# Patient Record
Sex: Male | Born: 1950
Health system: Southern US, Community
[De-identification: ages and names within clinical notes are randomized; demographics above are authoritative.]

## PROBLEM LIST (undated history)

## (undated) DIAGNOSIS — E785 Hyperlipidemia, unspecified: Secondary | ICD-10-CM

## (undated) DIAGNOSIS — E119 Type 2 diabetes mellitus without complications: Secondary | ICD-10-CM

## (undated) DIAGNOSIS — I10 Essential (primary) hypertension: Secondary | ICD-10-CM

## (undated) DIAGNOSIS — I639 Cerebral infarction, unspecified: Secondary | ICD-10-CM

## (undated) HISTORY — DX: Cerebral infarction, unspecified: I63.9

## (undated) HISTORY — DX: Hyperlipidemia, unspecified: E78.5

---

## 2016-07-13 ENCOUNTER — Emergency Department (INDEPENDENT_AMBULATORY_CARE_PROVIDER_SITE_OTHER)
Admission: EM | Admit: 2016-07-13 | Discharge: 2016-07-13 | Disposition: A | Discharge status: Home / Self Care | Payer: Medicare HMO | Source: Home / Self Care | Attending: Family Medicine | Admitting: Family Medicine

## 2016-07-13 ENCOUNTER — Encounter: Payer: Self-pay | Admitting: Emergency Medicine

## 2016-07-13 DIAGNOSIS — R600 Localized edema: Secondary | ICD-10-CM

## 2016-07-13 DIAGNOSIS — Z760 Encounter for issue of repeat prescription: Secondary | ICD-10-CM

## 2016-07-13 DIAGNOSIS — I10 Essential (primary) hypertension: Secondary | ICD-10-CM | POA: Diagnosis not present

## 2016-07-13 HISTORY — DX: Essential (primary) hypertension: I10

## 2016-07-13 HISTORY — DX: Type 2 diabetes mellitus without complications: E11.9

## 2016-07-13 LAB — POCT CBC W AUTO DIFF (K'VILLE URGENT CARE)

## 2016-07-13 MED ORDER — CLOPIDOGREL BISULFATE 75 MG PO TABS: 75.0000 mg | ORAL_TABLET | Freq: Every day | ORAL | 1 refills | Status: DC

## 2016-07-13 MED ORDER — LISINOPRIL-HYDROCHLOROTHIAZIDE 20-12.5 MG PO TABS: 1.0000 | ORAL_TABLET | Freq: Every day | ORAL | 0 refills | Status: DC

## 2016-07-13 NOTE — Discharge Instructions (Signed)
°  It is important to make a follow up appointment with primary care to review today's blood tests and to make sure you are on the correct medications as you will likely need to be placed back on your diabetes medication once the labs come back in 2-3 days.    If your blood pressure continues to be uncontrolled, you are at risk for a stroke, heart attack, or even kidney failure.   Because you have been off your medication for about 1 year, your blood pressure medication may initially cause your blood pressure to drop too low too fast so do not drive shortly after taking your first dose of medication so you can see how your body reacts.

## 2016-07-13 NOTE — ED Triage Notes (Signed)
Swelling feet and ankles x 6 months moved here from [place], needs PCP.

## 2016-07-13 NOTE — ED Provider Notes (Signed)
CSN: [id number]     Arrival date & time 07/13/16  1413 History   First MD Initiated Contact with Patient 07/13/16 1426     Chief Complaint  Patient presents with  . Foot Swelling   (Consider location/radiation/quality/duration/timing/severity/associated sxs/prior Treatment) HPI  Matthew Barton is a 65 y.o. male presenting to UC with c/o bilateral feet and leg swelling for about 6 months.  He is c/o bilateral leg cramping and soreness, described as a "tight" feeling.  He reports moving to the area about 2 years ago from [place]. He has not f/u with a PCP in over 1 year due to lack of insurance so he has been out of his diabetes and blood pressure medication for about 1 year. Denies chest pain or SOB. Denies headache or dizziness.  Pt denies hx of CAD or CHF but is suppose to be taking Plavix.  He is also suppose to be taking Metformin, Novolog and Lisinopril.  He has been on hydrochlorothiazide in the past but not recently.  Past Medical History:  Diagnosis Date  . Diabetes mellitus without complication (HCC)   . Hypertension    History reviewed. No pertinent surgical history. No family history on file. Social History  Substance Use Topics  . Smoking status: Never Smoker  . Smokeless tobacco: Never Used  . Alcohol use No    Review of Systems  Respiratory: Negative for cough, chest tightness and shortness of breath.   Cardiovascular: Positive for leg swelling. Negative for chest pain and palpitations.  Gastrointestinal: Negative for abdominal pain, nausea and vomiting.  Musculoskeletal: Positive for arthralgias, joint swelling and myalgias.  Skin: Negative for color change, rash and wound.  Neurological: Negative for dizziness, syncope, light-headedness and headaches.    Allergies  Statins  Home Medications   Prior to Admission medications   Medication Sig Start Date End Date Taking? Authorizing Provider  insulin aspart (NOVOLOG) 100 UNIT/ML injection Inject into the skin 3 (three)  times daily before meals.   Yes Historical Provider, MD  lisinopril (PRINIVIL,ZESTRIL) 10 MG tablet Take 10 mg by mouth daily.   Yes Historical Provider, MD  metFORMIN (GLUCOPHAGE) 1000 MG tablet Take 1,000 mg by mouth 2 (two) times daily with a meal.   Yes Historical Provider, MD  clopidogrel (PLAVIX) 75 MG tablet Take 1 tablet (75 mg total) by mouth daily. 07/13/16   Junius Finner, PA-C  lisinopril-hydrochlorothiazide (PRINZIDE,ZESTORETIC) 20-12.5 MG tablet Take 1 tablet by mouth daily. 07/13/16   Junius Finner, PA-C   Meds Ordered and Administered this Visit  Medications - No data to display  BP (!) 222/117   Pulse 85   Temp 98.2 F (36.8 C) (Oral)   Ht 5\' 11"  (1.803 m)   Wt (!) 345 lb (156.5 kg)   SpO2 94%   BMI 48.12 kg/m  No data found.   Physical Exam  Constitutional: He is oriented to person, place, and time. He appears well-developed and well-nourished. No distress.  Obese male sitting on exam bed, NAD.  HENT:  Head: Normocephalic and atraumatic.  Eyes: EOM are normal.  Neck: Normal range of motion.  Cardiovascular: Normal rate, regular rhythm and normal heart sounds.   Pulmonary/Chest: Effort normal and breath sounds normal. No respiratory distress. He has no wheezes. He has no rales.  Musculoskeletal: Normal range of motion. He exhibits edema and tenderness. He exhibits no deformity.  Bilateral legs and feet: 3+ pitting edema. Mild tenderness to feet. Full ROM ankle and toes. Calves are soft, non-tender.  Neurological:  He is alert and oriented to person, place, and time.  Skin: Skin is warm and dry. He is not diaphoretic.  Psychiatric: He has a normal mood and affect. His behavior is normal.  Nursing note and vitals reviewed.   Urgent Care Course   Clinical Course    Procedures (including critical care time)  Labs Review Labs Reviewed  COMPLETE METABOLIC PANEL WITH GFR  POCT CBC W AUTO DIFF ([facility])    Imaging Review No results  found.   MDM   1. Uncontrolled hypertension   2. Medication refill   3. Pedal edema    Pt presenting to UC with c/o bilateral leg swelling. Pt denies chest pain, SOB, headache or dizziness.  Leg swelling has progressed over 6 months. Insurance has now allowed him to come be evaluated.  BP- 207/99 and recheck- 222/117.  Likely due to being off his medications for so long. Pt is asymptomatic besides leg swelling.  CBC- unremarkable CMP- pending. Pt plans on scheduling f/u appointment with a PCP in 1-2 weeks. Rx: lisinopril-hydrochlorothiazide and Plavix. Will wait for baseline labs before restarting pt on diabetes medications.  Discussed symptoms that warrant emergent care in the ED including chest pain, SOB, headache or dizziness. Patient verbalized understanding and agreement with treatment plan.     Junius FinnerErin O'Malley, PA-C 07/13/16 1704

## 2016-07-14 ENCOUNTER — Telehealth: Payer: Self-pay | Admitting: *Deleted

## 2016-07-14 LAB — COMPLETE METABOLIC PANEL WITH GFR
ALT: 10 U/L (ref 9–46)
AST: 13 U/L (ref 10–35)
Albumin: 4.1 g/dL (ref 3.6–5.1)
Alkaline Phosphatase: 44 U/L (ref 40–115)
BUN: 10 mg/dL (ref 7–25)
CO2: 29 mmol/L (ref 20–31)
Calcium: 9.4 mg/dL (ref 8.6–10.3)
Chloride: 96 mmol/L — ABNORMAL LOW (ref 98–110)
Creat: 0.95 mg/dL (ref 0.70–1.25)
GFR, Est African American: >89 mL/min (ref >=60)
GFR, Est Non African American: 84 mL/min (ref >=60)
Glucose, Bld: 273 mg/dL — ABNORMAL HIGH (ref 65–99)
Potassium: 5 mmol/L (ref 3.5–5.3)
Sodium: 138 mmol/L (ref 135–146)
Total Bilirubin: 0.5 mg/dL (ref 0.2–1.2)
Total Protein: 7.3 g/dL (ref 6.1–8.1)

## 2016-07-14 NOTE — Telephone Encounter (Signed)
Callback: No answer. LMOM Labs WNL except for elevated glucose. Strongly encouraged f/u or establishment with PCP to control glucose and follow-up on hypertension. Callback as needed.

## 2016-07-22 ENCOUNTER — Ambulatory Visit (INDEPENDENT_AMBULATORY_CARE_PROVIDER_SITE_OTHER): Payer: Medicare HMO | Admitting: Osteopathic Medicine

## 2016-07-22 ENCOUNTER — Ambulatory Visit (INDEPENDENT_AMBULATORY_CARE_PROVIDER_SITE_OTHER): Payer: Medicare HMO

## 2016-07-22 ENCOUNTER — Encounter: Payer: Self-pay | Admitting: Osteopathic Medicine

## 2016-07-22 VITALS — BP 203/87 | HR 78 | Ht 71.0 in | Wt 338.0 lb

## 2016-07-22 DIAGNOSIS — E1159 Type 2 diabetes mellitus with other circulatory complications: Secondary | ICD-10-CM | POA: Diagnosis not present

## 2016-07-22 DIAGNOSIS — M545 Low back pain, unspecified: Secondary | ICD-10-CM

## 2016-07-22 DIAGNOSIS — G8929 Other chronic pain: Secondary | ICD-10-CM | POA: Insufficient documentation

## 2016-07-22 DIAGNOSIS — Z8673 Personal history of transient ischemic attack (TIA), and cerebral infarction without residual deficits: Secondary | ICD-10-CM | POA: Insufficient documentation

## 2016-07-22 DIAGNOSIS — E1165 Type 2 diabetes mellitus with hyperglycemia: Secondary | ICD-10-CM

## 2016-07-22 DIAGNOSIS — Z23 Encounter for immunization: Secondary | ICD-10-CM

## 2016-07-22 DIAGNOSIS — M4316 Spondylolisthesis, lumbar region: Secondary | ICD-10-CM

## 2016-07-22 DIAGNOSIS — IMO0002 Reserved for concepts with insufficient information to code with codable children: Secondary | ICD-10-CM | POA: Insufficient documentation

## 2016-07-22 DIAGNOSIS — I10 Essential (primary) hypertension: Secondary | ICD-10-CM | POA: Insufficient documentation

## 2016-07-22 DIAGNOSIS — M7989 Other specified soft tissue disorders: Secondary | ICD-10-CM

## 2016-07-22 DIAGNOSIS — R600 Localized edema: Secondary | ICD-10-CM | POA: Insufficient documentation

## 2016-07-22 MED ORDER — HYDROCHLOROTHIAZIDE 25 MG PO TABS: 25.0000 mg | ORAL_TABLET | Freq: Every day | ORAL | 1 refills | Status: DC

## 2016-07-22 MED ORDER — BLOOD GLUCOSE MONITOR KIT: PACK | 0 refills | Status: DC

## 2016-07-22 MED ORDER — NAPROXEN 500 MG PO TABS: 500.0000 mg | ORAL_TABLET | Freq: Two times a day (BID) | ORAL | 3 refills | Status: DC

## 2016-07-22 MED ORDER — LISINOPRIL 40 MG PO TABS: 40.0000 mg | ORAL_TABLET | Freq: Every day | ORAL | 1 refills | Status: DC

## 2016-07-22 MED ORDER — METFORMIN HCL 1000 MG PO TABS: ORAL_TABLET | ORAL | 3 refills | Status: DC

## 2016-07-22 NOTE — Progress Notes (Signed)
HPI: Matthew Barton is a 65 y.o. male  who presents to [facility] today, 07/22/16,  for chief complaint of:  Chief Complaint  Patient presents with  . Establish Care    BACK PAIN    Uncontrolled hypertension BP 222/117 in urgent care 07-2016, patient at that time was restarted on lisinopril-HCTZ 20-12 0.5 mg. Blood pressure today slightly improved. No chest pain, pressure, headache, shortness of breath.    Edema, lower extremity This is main reason he went to urgent care, has been ongoing for about a year.    Chronic bilateral low back pain without sciatica Main concern for today's visit in addition to swelling and blood pressure problems as noted above. He reports ongoing back pain for about a year. Never been to physical therapy, never had injections/MRI/x-ray. Thinks he may have been on meloxicam before. Did not tolerate muscle relaxers well. Like to avoid opiate pain medications if possible.    Type II diabetes mellitus, uncontrolled ([place]) Currently not on any medications. Patient states she was previously on insulin.    History of CVA (cerebrovascular accident) Some residual left-sided weakness after CVA 2 or 3 years ago. Patient is on Plavix.       Past medical, surgical, social and family history reviewed: Past Medical History:  Diagnosis Date  . Diabetes mellitus without complication ([place])   . Hyperlipidemia   . Hypertension   . Stroke [facility])    History reviewed. No pertinent surgical history. Social History  Substance Use Topics  . Smoking status: Never Smoker  . Smokeless tobacco: Never Used  . Alcohol use No   History reviewed. No pertinent family history.   Current medication list and allergy/intolerance information reviewed:   Current Outpatient Prescriptions  Medication Sig Dispense Refill  . clopidogrel (PLAVIX) 75 MG tablet Take 1 tablet (75 mg total) by mouth daily. 30 tablet 1  . lisinopril-hydrochlorothiazide  (PRINZIDE,ZESTORETIC) 20-12.5 MG tablet Take 1 tablet by mouth daily. 30 tablet 0   No current facility-administered medications for this visit.    Allergies  Allergen Reactions  . Statins       Review of Systems:  Constitutional:  No  fever, no chills, No recent illness, No unintentional weight changes. No significant fatigue.   HEENT: No  headache, no vision change, no hearing change, No sore throat, No  sinus pressure  Cardiac: No  chest pain, No  pressure, No palpitations, No  Orthopnea  Respiratory:  No  shortness of breath. +Cough  Gastrointestinal: No  abdominal pain, No  nausea, No  vomiting,  No  blood in stool, No  diarrhea, No  constipation   Musculoskeletal: No new myalgia/arthralgia  Genitourinary: No  incontinence, No  abnormal genital bleeding, No abnormal genital discharge, +concern w/ sexual function  Skin: No  Rash, No other wounds/concerning lesions  Hem/Onc: No  easy bruising/bleeding, No  abnormal lymph node  Endocrine: No cold intolerance,  No heat intolerance. +polyuria, no polydipsia/polyphagia   Neurologic: No  weakness, No  dizziness, No  slurred speech/focal weakness/facial droop  Psychiatric: No  concerns with depression, No  concerns with anxiety, +sleep problems, No mood problems  Exam:  BP (!) 203/87   Pulse 78   Ht '5\' 11"'$  (1.803 m)   Wt (!) 338 lb (153.3 kg)   BMI 47.14 kg/m    Constitutional: VS see above. General Appearance: alert, well-developed, obese, NAD  Eyes: Normal lids and conjunctive, non-icteric sclera  Ears, Nose, Mouth, Throat: MMM  Neck: No masses, trachea midline. No thyroid enlargement. No tenderness/mass appreciated. No lymphadenopathy  Respiratory: Normal respiratory effort. no wheeze, no rhonchi, no rales  Cardiovascular: S1/S2 normal, no murmur, no rub/gallop auscultated. RRR. +1-2 lower extremity edema. Pedal pulse palpable. No JVD.   Musculoskeletal: Gait normal. No midline tenderness  Neurological:  Normal balance/coordination. No tremor.   Skin: warm, dry, intact. No rash/ulcer.  Psychiatric: Normal judgment/insight. Normal mood and affect. Oriented x3.     ASSESSMENT/PLAN:   Chronic bilateral low back pain without sciatica - Plan: DG Lumbar Spine Complete, naproxen (NAPROSYN) 500 MG tablet  Need for prophylactic vaccination and inoculation against influenza - Plan: Flu Vaccine QUAD 36+ mos IM  Uncontrolled type 2 diabetes mellitus with other circulatory complication, without long-term current use of insulin ([place]) - Plan: metFORMIN (GLUCOPHAGE) 1000 MG tablet, Hemoglobin A1c, blood glucose meter kit and supplies KIT  Uncontrolled hypertension - Plan: lisinopril (PRINIVIL,ZESTRIL) 40 MG tablet, hydrochlorothiazide (HYDRODIURIL) 25 MG tablet, CBC with Differential/Platelet, COMPLETE METABOLIC PANEL WITH GFR, Lipid panel, TSH, B Nat Peptide  Swelling of lower extremity - Plan: B Nat Peptide  Edema, lower extremity  History of CVA (cerebrovascular accident)     Patient Instructions  Blood pressure: Double up on your current Lisinopril/HCT medication until out, then start the Lisinopril 40 mg once per day + HCT 25 mg once per day  Diabetes: restart metformin, we will need to see what the A1C is looking like to determine addition of other medications or starting insulin.   Back pain: starting antiinflammatory Naproxen and getting Xray today, followup based on response to medications. See attached for home exercises.     Visit summary with medication list and pertinent instructions was printed for patient to review. All questions at time of visit were answered - patient instructed to contact office with any additional concerns. ER/RTC precautions were reviewed with the patient. Follow-up plan: Return in about 1 week (around 07/29/2016) for BP AND LABS/DIABETES FOLLOWUP (OV30).

## 2016-07-22 NOTE — Patient Instructions (Addendum)
Blood pressure: Double up on your current Lisinopril/HCT medication until out, then start the Lisinopril 40 mg once per day + HCT 25 mg once per day  Diabetes: restart metformin, we will need to see what the A1C is looking like to determine addition of other medications or starting insulin.   Back pain: starting antiinflammatory Naproxen and getting Xray today, followup based on response to medications. See attached for home exercises.

## 2016-07-23 LAB — CBC WITH DIFFERENTIAL/PLATELET
Basophils Absolute: 0 cells/uL (ref 0–200)
Basophils Relative: 0 %
EOS PCT: 3 %
Eosinophils Absolute: 249 cells/uL (ref 15–500)
HCT: 43.6 % (ref 38.5–50.0)
HEMOGLOBIN: 14.5 g/dL (ref 13.2–17.1)
LYMPHS ABS: 3569 {cells}/uL (ref 850–3900)
LYMPHS PCT: 43 %
MCH: 26.2 pg — ABNORMAL LOW (ref 27.0–33.0)
MCHC: 33.3 g/dL (ref 32.0–36.0)
MCV: 78.7 fL — ABNORMAL LOW (ref 80.0–100.0)
MPV: 10.6 fL (ref 7.5–12.5)
Monocytes Absolute: 747 cells/uL (ref 200–950)
Monocytes Relative: 9 %
NEUTROS PCT: 45 %
Neutro Abs: 3735 cells/uL (ref 1500–7800)
PLATELETS: 355 10*3/uL (ref 140–400)
RBC: 5.54 MIL/uL (ref 4.20–5.80)
RDW: 14.2 % (ref 11.0–15.0)
WBC: 8.3 10*3/uL (ref 3.8–10.8)

## 2016-07-23 LAB — COMPLETE METABOLIC PANEL WITH GFR
ALT: 14 U/L (ref 9–46)
AST: 13 U/L (ref 10–35)
Albumin: 4.2 g/dL (ref 3.6–5.1)
Alkaline Phosphatase: 50 U/L (ref 40–115)
BUN: 16 mg/dL (ref 7–25)
CHLORIDE: 95 mmol/L — AB (ref 98–110)
CO2: 31 mmol/L (ref 20–31)
Calcium: 9.5 mg/dL (ref 8.6–10.3)
Creat: 1.15 mg/dL (ref 0.70–1.25)
GFR, Est African American: 77 mL/min (ref >=60)
GFR, Est Non African American: 66 mL/min (ref >=60)
GLUCOSE: 382 mg/dL — AB (ref 65–99)
POTASSIUM: 4.7 mmol/L (ref 3.5–5.3)
SODIUM: 135 mmol/L (ref 135–146)
Total Bilirubin: 0.5 mg/dL (ref 0.2–1.2)
Total Protein: 7.3 g/dL (ref 6.1–8.1)

## 2016-07-23 LAB — LIPID PANEL
Cholesterol: 283 mg/dL — ABNORMAL HIGH (ref 125–200)
HDL: 37 mg/dL — AB (ref >=40)
LDL CALC: 172 mg/dL — AB (ref <130)
TRIGLYCERIDES: 370 mg/dL — AB (ref <150)
Total CHOL/HDL Ratio: 7.6 Ratio — ABNORMAL HIGH (ref <=5.0)
VLDL: 74 mg/dL — AB (ref <30)

## 2016-07-23 LAB — TSH: TSH: 2.28 mIU/L (ref 0.40–4.50)

## 2016-07-23 LAB — BRAIN NATRIURETIC PEPTIDE: Brain Natriuretic Peptide: 47.4 pg/mL (ref <100)

## 2016-07-23 LAB — HEMOGLOBIN A1C
Hgb A1c MFr Bld: 11.4 % — ABNORMAL HIGH (ref <5.7)
Mean Plasma Glucose: 280 mg/dL

## 2016-07-29 ENCOUNTER — Encounter: Payer: Self-pay | Admitting: Osteopathic Medicine

## 2016-07-29 ENCOUNTER — Ambulatory Visit (INDEPENDENT_AMBULATORY_CARE_PROVIDER_SITE_OTHER): Payer: Medicare HMO | Admitting: Osteopathic Medicine

## 2016-07-29 VITALS — BP 203/81 | HR 78 | Ht 71.0 in | Wt 331.0 lb

## 2016-07-29 DIAGNOSIS — E1159 Type 2 diabetes mellitus with other circulatory complications: Secondary | ICD-10-CM | POA: Diagnosis not present

## 2016-07-29 DIAGNOSIS — E1142 Type 2 diabetes mellitus with diabetic polyneuropathy: Secondary | ICD-10-CM | POA: Insufficient documentation

## 2016-07-29 DIAGNOSIS — I10 Essential (primary) hypertension: Secondary | ICD-10-CM | POA: Diagnosis not present

## 2016-07-29 DIAGNOSIS — E7800 Pure hypercholesterolemia, unspecified: Secondary | ICD-10-CM

## 2016-07-29 DIAGNOSIS — IMO0002 Reserved for concepts with insufficient information to code with codable children: Secondary | ICD-10-CM

## 2016-07-29 DIAGNOSIS — G8929 Other chronic pain: Secondary | ICD-10-CM

## 2016-07-29 DIAGNOSIS — E1165 Type 2 diabetes mellitus with hyperglycemia: Secondary | ICD-10-CM

## 2016-07-29 DIAGNOSIS — M545 Low back pain, unspecified: Secondary | ICD-10-CM

## 2016-07-29 DIAGNOSIS — R600 Localized edema: Secondary | ICD-10-CM

## 2016-07-29 DIAGNOSIS — Z8673 Personal history of transient ischemic attack (TIA), and cerebral infarction without residual deficits: Secondary | ICD-10-CM

## 2016-07-29 MED ORDER — PITAVASTATIN CALCIUM 2 MG PO TABS: 2.0000 mg | ORAL_TABLET | Freq: Every day | ORAL | 3 refills | Status: DC

## 2016-07-29 MED ORDER — PRAVASTATIN SODIUM 20 MG PO TABS
20.0000 mg | ORAL_TABLET | Freq: Every day | ORAL | 1 refills | Status: DC
Start: 2016-07-29 — End: 2016-07-29

## 2016-07-29 MED ORDER — INSULIN GLARGINE 300 UNIT/ML ~~LOC~~ SOPN: 20.0000 [IU] | PEN_INJECTOR | Freq: Every day | SUBCUTANEOUS | 3 refills | Status: DC

## 2016-07-29 MED ORDER — FUROSEMIDE 40 MG PO TABS: 40.0000 mg | ORAL_TABLET | Freq: Two times a day (BID) | ORAL | 1 refills | Status: DC

## 2016-07-29 NOTE — Progress Notes (Signed)
HPI: Matthew Barton is a 65 y.o. male  who presents to [facility] today, 07/29/16,  for chief complaint of:  Chief Complaint  Patient presents with  . Follow-up    HTN, DM2, Back Pain    Uncontrolled hypertension BP 222/117 in urgent care 07-2016, patient at that time was restarted on lisinopril-HCTZ 20-12 0.5 mg. Blood pressure last visit was slightly improved but is up today despite medication adjustment last visit. No chest pain, pressure, headache, shortness of breath.    Edema, lower extremity This is main reason he went to urgent care, has been ongoing for about a year. BNP 07/22/16 was normal, no Hx CHF.  Chronic bilateral low back pain without sciatica Main concern for last visit in addition to swelling and blood pressure problems as noted above. He reports ongoing back pain for about a year. Never been to physical therapy, never had injections/MRI/x-ray. Thinks he may have been on meloxicam before. Did not tolerate muscle relaxers well. Like to avoid opiate pain medications if possible.    Type II diabetes mellitus, uncontrolled (HCC) Currently not on any medications. Patient states he was previously on insulin. Fasting Glc 327 lately down from 400s. Loose stool since starting back on the metformin, he increased the dose pretty rapidly up to 1000 twice a day   History of CVA (cerebrovascular accident) Some residual left-sided weakness after CVA 2 or 3 years ago. Patient is on Plavix. No new weakness. Statin intolerance      Past medical, surgical, social and family history reviewed: Past Medical History:  Diagnosis Date  . Diabetes mellitus without complication (HCC)   . Hyperlipidemia   . Hypertension   . Stroke [facility])    No past surgical history on file. Social History  Substance Use Topics  . Smoking status: Never Smoker  . Smokeless tobacco: Never Used  . Alcohol use No   No family history on file.   Current medication list and  allergy/intolerance information reviewed:   Current Outpatient Prescriptions  Medication Sig Dispense Refill  . blood glucose meter kit and supplies KIT Dispense based on patient and insurance preference. Use up to four times daily as directed. ICD 10 Dx: E11.59. Please dispense lancets x100 refill 99, test strips x100 refill 99, control solution x1 refill 99, and sharps container 1 each 0  . clopidogrel (PLAVIX) 75 MG tablet Take 1 tablet (75 mg total) by mouth daily. 30 tablet 1  . hydrochlorothiazide (HYDRODIURIL) 25 MG tablet Take 1 tablet (25 mg total) by mouth daily. 30 tablet 1  . lisinopril (PRINIVIL,ZESTRIL) 40 MG tablet Take 1 tablet (40 mg total) by mouth daily. 30 tablet 1  . metFORMIN (GLUCOPHAGE) 1000 MG tablet Start at 1/2 tablet (500 mg) by mouth daily and increase as tolerated to goal dose of 1 tablet (1000 mg) twice daily 180 tablet 3  . naproxen (NAPROSYN) 500 MG tablet Take 1 tablet (500 mg total) by mouth 2 (two) times daily with a meal. Prn pain 60 tablet 3   No current facility-administered medications for this visit.    Allergies  Allergen Reactions  . Statins       Review of Systems:  Constitutional:  No  fever, no chills, No recent illness  HEENT: No  headache, no vision change  Cardiac: No  chest pain, No  pressure, No palpitations, No  Orthopnea  Respiratory:  No  shortness of breath  Gastrointestinal: No  abdominal pain   Musculoskeletal: No new  myalgia/arthralgia  Exam:  BP (!) 203/81   Pulse 78   Ht '5\' 11"'$  (1.803 m)   Wt (!) 331 lb (150.1 kg)   BMI 46.17 kg/m    Constitutional: VS see above. General Appearance: alert, well-developed, obese, NAD  Eyes: Normal lids and conjunctive, non-icteric sclera  Ears, Nose, Mouth, Throat: MMM  Neck: No masses, trachea midline. No thyroid enlargement. No tenderness/mass appreciated. No lymphadenopathy  Respiratory: Normal respiratory effort. no wheeze, no rhonchi, no rales  Cardiovascular: S1/S2  normal, no murmur, no rub/gallop auscultated. RRR. +1 lower extremity edema. Pedal pulse palpable. No JVD.   Musculoskeletal: Gait normal. No midline tenderness  Neurological: Normal balance/coordination. No tremor.   Skin: warm, dry, intact. No rash/ulcer.  Psychiatric: Normal judgment/insight. Normal mood and affect. Oriented x3      07/13/2016 07/22/2016 Most Recent Value Last 5 Years  BLOOD PRESSURE - SYSTOLIC 694 [id number] 03/50/0938  BLOOD PRESSURE - DIASTOLIC 182 87 87 99/37/1696    Recent Results (from the past 2160 hour(s))  COMPLETE METABOLIC PANEL WITH GFR     Status: Abnormal   Collection Time: 07/13/16  2:33 PM  Result Value Ref Range   Sodium 138 135 - 146 mmol/L   Potassium 5.0 3.5 - 5.3 mmol/L   Chloride 96 (L) 98 - 110 mmol/L   CO2 29 20 - 31 mmol/L   Glucose, Bld 273 (H) 65 - 99 mg/dL   BUN 10 7 - 25 mg/dL   Creat 0.95 0.70 - 1.25 mg/dL    Comment:   For patients > or = 65 years of age: The upper reference limit for Creatinine is approximately 13% higher for people identified as African-American.      Total Bilirubin 0.5 0.2 - 1.2 mg/dL   Alkaline Phosphatase 44 40 - 115 U/L   AST 13 10 - 35 U/L   ALT 10 9 - 46 U/L   Total Protein 7.3 6.1 - 8.1 g/dL   Albumin 4.1 3.6 - 5.1 g/dL   Calcium 9.4 8.6 - 10.3 mg/dL   GFR, Est African American >89 >=60 mL/min   GFR, Est Non African American 84 >=60 mL/min  POCT CBC w auto diff     Status: None   Collection Time: 07/13/16  2:47 PM  Result Value Ref Range   WBC  4.5 - 10.5 K/uL    Comment: see scanned report   Lymphocytes relative %  15 - 45 %   Monocytes relative %  2 - 10 %   Neutrophils relative % (GR)  44 - 76 %   Lymphocytes absolute  0.1 - 1.8 K/uL   Monocyes absolute  0.1 - 1 K/uL   Neutrophils absolute (GR#)  1.7 - 7.7 K/uL   RBC  4.2 - 5.8 MIL/uL   Hemoglobin  13 - 17 g/dL   Hematocrit  38.5 - 51 %   MCV  80 - 98 fL   MCH  26.5 - 32.5 pg   MCHC  32.5 - 36.9 g/dL   RDW  11.6 - 14 %    Platelet count  140 - 400 K/uL   MPV  7.8 - 11 fL  CBC with Differential/Platelet     Status: Abnormal   Collection Time: 07/22/16 11:55 AM  Result Value Ref Range   WBC 8.3 3.8 - 10.8 K/uL   RBC 5.54 4.20 - 5.80 MIL/uL   Hemoglobin 14.5 13.2 - 17.1 g/dL   HCT 43.6 38.5 - 50.0 %  MCV 78.7 (L) 80.0 - 100.0 fL   MCH 26.2 (L) 27.0 - 33.0 pg   MCHC 33.3 32.0 - 36.0 g/dL   RDW 14.2 11.0 - 15.0 %   Platelets 355 140 - 400 K/uL   MPV 10.6 7.5 - 12.5 fL   Neutro Abs 3,735 1,500 - 7,800 cells/uL   Lymphs Abs 3,569 850 - 3,900 cells/uL   Monocytes Absolute 747 200 - 950 cells/uL   Eosinophils Absolute 249 15 - 500 cells/uL   Basophils Absolute 0 0 - 200 cells/uL   Neutrophils Relative % 45 %   Lymphocytes Relative 43 %   Monocytes Relative 9 %   Eosinophils Relative 3 %   Basophils Relative 0 %   Smear Review Criteria for review not met   COMPLETE METABOLIC PANEL WITH GFR     Status: Abnormal   Collection Time: 07/22/16 11:55 AM  Result Value Ref Range   Sodium 135 135 - 146 mmol/L   Potassium 4.7 3.5 - 5.3 mmol/L   Chloride 95 (L) 98 - 110 mmol/L   CO2 31 20 - 31 mmol/L   Glucose, Bld 382 (H) 65 - 99 mg/dL   BUN 16 7 - 25 mg/dL   Creat 1.15 0.70 - 1.25 mg/dL    Comment:   For patients > or = 65 years of age: The upper reference limit for Creatinine is approximately 13% higher for people identified as African-American.      Total Bilirubin 0.5 0.2 - 1.2 mg/dL   Alkaline Phosphatase 50 40 - 115 U/L   AST 13 10 - 35 U/L   ALT 14 9 - 46 U/L   Total Protein 7.3 6.1 - 8.1 g/dL   Albumin 4.2 3.6 - 5.1 g/dL   Calcium 9.5 8.6 - 10.3 mg/dL   GFR, Est African American 77 >=60 mL/min   GFR, Est Non African American 66 >=60 mL/min  Hemoglobin A1c     Status: Abnormal   Collection Time: 07/22/16 11:55 AM  Result Value Ref Range   Hgb A1c MFr Bld 11.4 (H) <5.7 %    Comment:   For someone without known diabetes, a hemoglobin A1c value of 6.5% or greater indicates that they may have  diabetes and this should be confirmed with a follow-up test.   For someone with known diabetes, a value <7% indicates that their diabetes is well controlled and a value greater than or equal to 7% indicates suboptimal control. A1c targets should be individualized based on duration of diabetes, age, comorbid conditions, and other considerations.   Currently, no consensus exists for use of hemoglobin A1c for diagnosis of diabetes for children.      Mean Plasma Glucose 280 mg/dL  Lipid panel     Status: Abnormal   Collection Time: 07/22/16 11:55 AM  Result Value Ref Range   Cholesterol 283 (H) 125 - 200 mg/dL   Triglycerides 370 (H) <150 mg/dL   HDL 37 (L) >=40 mg/dL   Total CHOL/HDL Ratio 7.6 (H) <=5.0 Ratio   VLDL 74 (H) <30 mg/dL   LDL Cholesterol 172 (H) <130 mg/dL    Comment:   Total Cholesterol/HDL Ratio:CHD Risk                        Coronary Heart Disease Risk Table  Men       Women          1/2 Average Risk              3.4        3.3              Average Risk              5.0        4.4           2X Average Risk              9.6        7.1           3X Average Risk             23.4       11.0 Use the calculated Patient Ratio above and the CHD Risk table  to determine the patient's CHD Risk.   TSH     Status: None   Collection Time: 07/22/16 11:55 AM  Result Value Ref Range   TSH 2.28 0.40 - 4.50 mIU/L  B Nat Peptide     Status: None   Collection Time: 07/22/16 11:55 AM  Result Value Ref Range   Brain Natriuretic Peptide 47.4 <100 pg/mL    Comment:   BNP levels increase with age in the general population with the highest values seen in individuals greater than 21 years of age. Reference: Joellyn Rued Cardiol 2002; 67:893-81.        ASSESSMENT/PLAN:   Uncontrolled type 2 diabetes mellitus with other circulatory complication, without long-term current use of insulin (HCC) - Plan: Insulin Glargine (TOUJEO SOLOSTAR) 300  UNIT/ML SOPN  Uncontrolled hypertension - Plan: furosemide (LASIX) 40 MG tablet  Chronic bilateral low back pain without sciatica  History of CVA (cerebrovascular accident)  Edema, lower extremity - Plan: furosemide (LASIX) 40 MG tablet  Diabetic polyneuropathy associated with type 2 diabetes mellitus ([place])  Pure hypercholesterolemia - Plan: Pitavastatin Calcium (LIVALO) 2 MG TABS     Patient Instructions  Blood pressure: Lisinopril 40 mg per day Hydrochlorothiazide 25 mg per day We are adding furosemide/Lasix initial dose 20 mg twice per day with planning to increase to 40 mg twice per day, this should help with blood pressure as well as swelling  Diabetes: Reduced dose of metformin to half tablet once or twice per day until loose stool resolves, before increasing dose. Our goal is 1 tablet twice per day but if you continue to have loose stools me know and we will need to switch the formulation or stop the medicine altogether We are restarting insulin in the formulation called Toujeo, this is a long-acting insulin to be taken once per day. Based on your weight, we are starting at a dose of 20 units per day and gradually increasing the dose until your fasting blood sugars are in the 120s. Please let me know if there is any trouble getting the insulin pens or the appropriate needles from your pharmacy.  Cholesterol: We will try a medication called Livalo, if this is not covered by her insurance, it may need to go through a prior authorization due to her previous history of intolerance to statins. If when not able to get Livalo paid for, we will go to pravastatin.   We will also go ahead and place referral for dietitian consultation, with a good meal plan for you.  Your maximum heart rate is calculated at 155.  Would keep your heart rate no higher than 120. Ideally, would exercise for 30 minutes at a time 3-5 times per week.     Visit summary with medication list and pertinent  instructions was printed for patient to review. All questions at time of visit were answered - patient instructed to contact office with any additional concerns. ER/RTC precautions were reviewed with the patient. Follow-up plan: Return in about 1 week (around 08/05/2016) for BLOOD PRESSURE.

## 2016-07-29 NOTE — Patient Instructions (Addendum)
Blood pressure: Lisinopril 40 mg per day Hydrochlorothiazide 25 mg per day We are adding furosemide/Lasix initial dose 20 mg twice per day with planning to increase to 40 mg twice per day, this should help with blood pressure as well as swelling  Diabetes: Reduced dose of metformin to half tablet once or twice per day until loose stool resolves, before increasing dose. Our goal is 1 tablet twice per day but if you continue to have loose stools me know and we will need to switch the formulation or stop the medicine altogether We are restarting insulin in the formulation called Toujeo, this is a long-acting insulin to be taken once per day. Based on your weight, we are starting at a dose of 20 units per day and gradually increasing the dose until your fasting blood sugars are in the 120s. Please let me know if there is any trouble getting the insulin pens or the appropriate needles from your pharmacy.  Cholesterol: We will try a medication called Livalo, if this is not covered by her insurance, it may need to go through a prior authorization due to her previous history of intolerance to statins. If when not able to get Livalo paid for, we will go to pravastatin.   We will also go ahead and place referral for dietitian consultation, with a good meal plan for you.  Your maximum heart rate is calculated at 155. Would keep your heart rate no higher than 120. Ideally, would exercise for 30 minutes at a time 3-5 times per week.

## 2016-08-05 ENCOUNTER — Ambulatory Visit (INDEPENDENT_AMBULATORY_CARE_PROVIDER_SITE_OTHER): Payer: Medicare HMO | Admitting: Osteopathic Medicine

## 2016-08-05 ENCOUNTER — Encounter: Payer: Self-pay | Admitting: Osteopathic Medicine

## 2016-08-05 VITALS — BP 190/90 | HR 83 | Ht 71.0 in | Wt 324.0 lb

## 2016-08-05 DIAGNOSIS — E1142 Type 2 diabetes mellitus with diabetic polyneuropathy: Secondary | ICD-10-CM

## 2016-08-05 DIAGNOSIS — E1159 Type 2 diabetes mellitus with other circulatory complications: Secondary | ICD-10-CM

## 2016-08-05 DIAGNOSIS — R600 Localized edema: Secondary | ICD-10-CM | POA: Diagnosis not present

## 2016-08-05 DIAGNOSIS — E1165 Type 2 diabetes mellitus with hyperglycemia: Secondary | ICD-10-CM

## 2016-08-05 DIAGNOSIS — IMO0002 Reserved for concepts with insufficient information to code with codable children: Secondary | ICD-10-CM

## 2016-08-05 DIAGNOSIS — I10 Essential (primary) hypertension: Secondary | ICD-10-CM

## 2016-08-05 NOTE — Patient Instructions (Signed)
Bring BP cuff to next visit

## 2016-08-05 NOTE — Progress Notes (Signed)
HPI: Matthew Barton is a 65 y.o. male  who presents to [place] today, 08/05/16,  for chief complaint of:  Chief Complaint  Patient presents with  . Follow-up    BLOOD PRESSURE    Follow-up for blood pressure recheck. Notes significant improvement in lower extremity swelling since addition of Lasix, is at full dose at this point. Blood pressure minimally improved, no chest pain pressure or shortness of breath or headache. Has home blood pressure cuff at home, states that it is occasionally measuring systolic in the 527P. He does not have blood pressure cuff with him at this visit  Patient also reports that the Toujeo was not covered and neither was the Livalo. He has not been taking either of these medications because he did not get themselves.   Past medical, surgical, social and family history reviewed: Past Medical History:  Diagnosis Date  . Diabetes mellitus without complication ([place])   . Hyperlipidemia   . Hypertension   . Stroke [facility])    No past surgical history on file. Social History  Substance Use Topics  . Smoking status: Never Smoker  . Smokeless tobacco: Never Used  . Alcohol use No   No family history on file.   Current medication list and allergy/intolerance information reviewed:   Current Outpatient Prescriptions on File Prior to Visit  Medication Sig Dispense Refill  . blood glucose meter kit and supplies KIT Dispense based on patient and insurance preference. Use up to four times daily as directed. ICD 10 Dx: E11.59. Please dispense lancets x100 refill 99, test strips x100 refill 99, control solution x1 refill 99, and sharps container 1 each 0  . clopidogrel (PLAVIX) 75 MG tablet Take 1 tablet (75 mg total) by mouth daily. 30 tablet 1  . furosemide (LASIX) 40 MG tablet Take 1 tablet (40 mg total) by mouth 2 (two) times daily. Start with 1/2 tablet (20 mg total) by mouth 2 (two) times daily for 3 - 5 days then increase to  whole tablet dose. 60 tablet 1  . hydrochlorothiazide (HYDRODIURIL) 25 MG tablet Take 1 tablet (25 mg total) by mouth daily. 30 tablet 1  . Insulin Glargine (TOUJEO SOLOSTAR) 300 UNIT/ML SOPN Inject 20 Units into the skin daily. Increase dose by 3 - 5 units twice per week to goal fasting glucose of 120 then hold at that dose. Please also dispense appropriate pen needles x100 Refill #99 7.5 mL 3  . lisinopril (PRINIVIL,ZESTRIL) 40 MG tablet Take 1 tablet (40 mg total) by mouth daily. 30 tablet 1  . metFORMIN (GLUCOPHAGE) 1000 MG tablet Start at 1/2 tablet (500 mg) by mouth daily and increase as tolerated to goal dose of 1 tablet (1000 mg) twice daily 180 tablet 3  . naproxen (NAPROSYN) 500 MG tablet Take 1 tablet (500 mg total) by mouth 2 (two) times daily with a meal. Prn pain 60 tablet 3  . Pitavastatin Calcium (LIVALO) 2 MG TABS Take 1 tablet (2 mg total) by mouth daily. 30 tablet 3   No current facility-administered medications on file prior to visit.    Allergies  Allergen Reactions  . Statins       Review of Systems:  Constitutional: No recent illness  HEENT: No  headache, no vision change  Cardiac: No  chest pain, No  pressure, No palpitations  Respiratory:  No  shortness of breath. No  Cough  Neurologic: No  weakness, No  Dizziness   Exam:  BP Marland Kitchen)  190/90   Pulse 83   Ht _0  (1.803 m)   Wt (!) 324 lb (147 kg)   BMI 45.19 kg/m   Constitutional: VS see above. General Appearance: alert, well-developed, well-nourished, NAD  Eyes: Normal lids and conjunctive, non-icteric sclera  Ears, Nose, Mouth, Throat: MMM, Normal external inspection ears/nares/mouth/lips/gums.  Neck: No masses, trachea midline.   Respiratory: Normal respiratory effort. no wheeze, no rhonchi, no rales  Cardiovascular: S1/S2 normal, no murmur, no rub/gallop auscultated. RRR.   Musculoskeletal: Gait normal. Symmetric and independent movement of all extremities  Neurological: Normal  balance/coordination. No tremor.  Skin: warm, dry, intact.   Psychiatric: Normal judgment/insight. Normal mood and affect. Oriented x3.     ASSESSMENT/PLAN:  The medications as is for now. Patient to follow-up with home blood pressure cuff measurements and cuff physically with him to the next visit. Will see what happened as far as getting the insulin and the Livalo covered for him. May need prior authorization  Uncontrolled hypertension  Uncontrolled type 2 diabetes mellitus with other circulatory complication, without long-term current use of insulin (HCC)  Edema, lower extremity  Diabetic polyneuropathy associated with type 2 diabetes mellitus ([place])    Patient Instructions  Bring BP cuff to next visit!     Visit summary with medication list and pertinent instructions was printed for patient to review. All questions at time of visit were answered - patient instructed to contact office with any additional concerns. ER/RTC precautions were reviewed with the patient. Follow-up plan: Return in about 2 weeks (around 08/19/2016) for recheck BP and labs .

## 2016-08-07 ENCOUNTER — Telehealth: Payer: Self-pay

## 2016-08-07 ENCOUNTER — Encounter: Payer: Self-pay | Admitting: Osteopathic Medicine

## 2016-08-07 MED ORDER — INSULIN GLARGINE 100 UNIT/ML SOLOSTAR PEN: PEN_INJECTOR | SUBCUTANEOUS | 99 refills | Status: DC

## 2016-08-07 NOTE — Telephone Encounter (Signed)
I think there were some issues getting his Toujeo covered, I switched it over to Lantus. Can send a prescription today via fax, instructions are too long to send electronically.

## 2016-08-07 NOTE — Telephone Encounter (Signed)
Left information on Pt's VM, callback information provided.  

## 2016-08-07 NOTE — Telephone Encounter (Signed)
Patient called stated that he did not get his Rx for Insulin. Please advise. Olivia Pavelko,CMA

## 2016-08-12 ENCOUNTER — Telehealth: Payer: Self-pay | Admitting: *Deleted

## 2016-08-12 DIAGNOSIS — E1159 Type 2 diabetes mellitus with other circulatory complications: Secondary | ICD-10-CM

## 2016-08-12 DIAGNOSIS — IMO0002 Reserved for concepts with insufficient information to code with codable children: Secondary | ICD-10-CM

## 2016-08-12 DIAGNOSIS — E1165 Type 2 diabetes mellitus with hyperglycemia: Secondary | ICD-10-CM

## 2016-08-12 NOTE — Telephone Encounter (Signed)
A PA was not generated for him because Toujeo is preferred it's just a higher tier. If lantus didn't work then I and do a PA to see if step therapy is required. I just haven't gotten to this yet as I was going down the list of incoming PAs. Insurance may or may not cover livalo because although his allergic to statins ( joint pain will not count) his triglycerides were not over 500 which is what they will require. Will submit and see

## 2016-08-12 NOTE — Telephone Encounter (Signed)
-----   Message from Sunnie NielsenNatalie Alexander, DO sent at 08/05/2016 11:12 AM EDT ----- Regarding: ? Prior auth needed for [employer] and Livalo ? Patient states that Toujeo and Livalo were not covered. This is a poorly controlled diabetic, needs to be on insulin and statin. Did pharmacy ever contact us regarding prior authorization? If this does something that I can send alternative insulin for her can we can Toujeo covered? Thanks in advance

## 2016-08-12 NOTE — Telephone Encounter (Signed)
Spoke with Pt's wife, Lantus Rx is too expensive. Livalo Rx is also too expensive. Will route to Provider for new Rx

## 2016-08-12 NOTE — Telephone Encounter (Signed)
Will route to PA on insulin to get further information regarding preferred step therapy.

## 2016-08-12 NOTE — Telephone Encounter (Signed)
Alas for the statins! That's ok. Thanks for looking into all this. Let me know if there is anything else needed.

## 2016-08-12 NOTE — Telephone Encounter (Signed)
Re: Lantus - can we contact drug rep for Lantus and figure out what we need to do to see if patient qualifies for low-cost insulin? 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 In the meantime, he really does need to be on insulin, can we get him a sample from us? Can he call his insurance company and ask what insulins are on their formulary?   Re: Livalo: Please confirm whether pharmacy sent us anything for prior authorization for this medicine?

## 2016-08-13 MED ORDER — INSULIN GLARGINE 300 UNIT/ML ~~LOC~~ SOPN: 20.0000 [IU] | PEN_INJECTOR | Freq: Every day | SUBCUTANEOUS | 0 refills | Status: DC

## 2016-08-13 NOTE — Telephone Encounter (Signed)
Called patient; we do have samples of toujeo here. Left vm asking him to come get toujeo samples until we can figure out what to do about insulin. Disp 3 pens. Directions printed and attached to the boxes   Looked up [employer] patient assist:   Eligibility Info: This program offers Reimbursement, Resource and Patient Assistance information. Must not have prescription coverage and must not be eligible for state or federal programs such as Medicare and Medicaid. For most medications (excluding Lovenox) patients with Medicare Part D might be considered if they are ineligible for Low Income Subsidy and have spent at least 5% of their annual household income (out of pocket) on medications.   Also on vm asked that patient call his insurance co and ask for a list of inulins on his formulary with the tier info  PA submitted for toujeo since we had samples of this

## 2016-08-14 NOTE — Telephone Encounter (Signed)
toujeo denied. Letter placed in provider's basket

## 2016-08-19 ENCOUNTER — Ambulatory Visit (INDEPENDENT_AMBULATORY_CARE_PROVIDER_SITE_OTHER): Payer: Medicare HMO | Admitting: Osteopathic Medicine

## 2016-08-19 ENCOUNTER — Encounter: Payer: Self-pay | Admitting: Osteopathic Medicine

## 2016-08-19 VITALS — BP 155/85 | HR 81 | Ht 71.0 in | Wt 328.0 lb

## 2016-08-19 DIAGNOSIS — IMO0002 Reserved for concepts with insufficient information to code with codable children: Secondary | ICD-10-CM

## 2016-08-19 DIAGNOSIS — K2900 Acute gastritis without bleeding: Secondary | ICD-10-CM

## 2016-08-19 DIAGNOSIS — I10 Essential (primary) hypertension: Secondary | ICD-10-CM

## 2016-08-19 DIAGNOSIS — E7800 Pure hypercholesterolemia, unspecified: Secondary | ICD-10-CM | POA: Diagnosis not present

## 2016-08-19 DIAGNOSIS — E1159 Type 2 diabetes mellitus with other circulatory complications: Secondary | ICD-10-CM | POA: Diagnosis not present

## 2016-08-19 DIAGNOSIS — R600 Localized edema: Secondary | ICD-10-CM | POA: Diagnosis not present

## 2016-08-19 DIAGNOSIS — E1165 Type 2 diabetes mellitus with hyperglycemia: Secondary | ICD-10-CM

## 2016-08-19 MED ORDER — ADVOCATE CONTROL SOLUTION HIGH VI LIQD
99 refills | Status: DC
Start: 2016-08-19 — End: 2017-04-19

## 2016-08-19 MED ORDER — LANCET DEVICE MISC: 99 refills | Status: DC

## 2016-08-19 MED ORDER — GLUCOSE BLOOD VI STRP: ORAL_STRIP | 99 refills | Status: DC

## 2016-08-19 MED ORDER — INSULIN NPH ISOPHANE & REGULAR (70-30) 100 UNIT/ML ~~LOC~~ SUSP: 30.0000 [IU] | Freq: Two times a day (BID) | SUBCUTANEOUS | 3 refills | Status: DC

## 2016-08-19 MED ORDER — LOVASTATIN 10 MG PO TABS: 10.0000 mg | ORAL_TABLET | Freq: Every day | ORAL | 6 refills | Status: DC

## 2016-08-19 NOTE — Progress Notes (Signed)
HPI: Matthew Barton is a 65 y.o. male  who presents to [place] today, 08/19/16,  for chief complaint of:  Chief Complaint  Patient presents with  . Follow-up    BLOOD PRESSURE     HTN: last seen in office 08/05/16 (2 weeks ago), 190/90. Home BP cuff here with him today.  Home cuff  In office: 213/97 Manual cuff by Dr. Loni Muse: 155/85. Can faintly hear blood flow through the cuff at 200s range but audible pulse heard at about 155 Stopped Furosemide due to concern for GI side effects but restarted this morning. Has been taking Lisinopril, HCT. Lower extremity edema has essentially resolved  DM2: Stopped the Metformin, concern for Gi upset (was feeling bad for about a week). 70/30 30 units twice per day. Fasting levels are down to 250s (down from 300-450s, max was almost 500). Did not come for samples. Prefers to be on the 70/30 insulin for now.   HLD: we were unable to get Livalo approved, he is intolerant to all other statin medications, but he can recall being on lovastatin in the past he thinks without any issues. Requests prescription for this medication.  GI: Patient had episode/illness over the course of a week or so where he was experiencing some nausea and GI upset and his sugars were elevated. He did not seek medical attention for this, he stopped his Lasix and metformin. Symptoms have resolved at this point.  Patient is accompanied by wife Hinton Dyer who assists with history-taking.   Past medical, surgical, social and family history reviewed: Past Medical History:  Diagnosis Date  . Diabetes mellitus without complication ([place])   . Hyperlipidemia   . Hypertension   . Stroke [facility])    No past surgical history on file. Social History  Substance Use Topics  . Smoking status: Never Smoker  . Smokeless tobacco: Never Used  . Alcohol use No   No family history on file.   Current medication list and allergy/intolerance information reviewed:    Current Outpatient Prescriptions on File Prior to Visit  Medication Sig Dispense Refill  . blood glucose meter kit and supplies KIT Dispense based on patient and insurance preference. Use up to four times daily as directed. ICD 10 Dx: E11.59. Please dispense lancets x100 refill 99, test strips x100 refill 99, control solution x1 refill 99, and sharps container 1 each 0  . clopidogrel (PLAVIX) 75 MG tablet Take 1 tablet (75 mg total) by mouth daily. 30 tablet 1  . furosemide (LASIX) 40 MG tablet Take 1 tablet (40 mg total) by mouth 2 (two) times daily. Start with 1/2 tablet (20 mg total) by mouth 2 (two) times daily for 3 - 5 days then increase to whole tablet dose. 60 tablet 1  . hydrochlorothiazide (HYDRODIURIL) 25 MG tablet Take 1 tablet (25 mg total) by mouth daily. 30 tablet 1  . Insulin Glargine (LANTUS SOLOSTAR) 100 UNIT/ML Solostar Pen Inject 20 Units into the skin daily. Increase dose by 3 - 5 units twice per week to goal fasting glucose of 120 then hold at that dose. Please also dispense appropriate pen needles x100 Refill #99 5 pen PRN  . Insulin Glargine (TOUJEO SOLOSTAR) 300 UNIT/ML SOPN Inject 20 Units into the skin daily. Increase dose by 3 - 5 units twice per week to goal fasting glucose of 120 then hold at that dose. 3 pen 0  . lisinopril (PRINIVIL,ZESTRIL) 40 MG tablet Take 1 tablet (40 mg total) by mouth  daily. 30 tablet 1  . naproxen (NAPROSYN) 500 MG tablet Take 1 tablet (500 mg total) by mouth 2 (two) times daily with a meal. Prn pain 60 tablet 3  . Pitavastatin Calcium (LIVALO) 2 MG TABS Take 1 tablet (2 mg total) by mouth daily. 30 tablet 3  . metFORMIN (GLUCOPHAGE) 1000 MG tablet Start at 1/2 tablet (500 mg) by mouth daily and increase as tolerated to goal dose of 1 tablet (1000 mg) twice daily (Patient not taking: Reported on 08/19/2016) 180 tablet 3   No current facility-administered medications on file prior to visit.    Allergies  Allergen Reactions  . Statins        Review of Systems:  Constitutional: No recent illness  HEENT: No  headache, no vision change  Cardiac: No  chest pain, No  pressure, No palpitations  Respiratory:  No  shortness of breath. No  Cough  Gastrointestinal: No  abdominal pain, no change on bowel habits  Musculoskeletal: No new myalgia/arthralgia  Skin: No  Rash  Hem/Onc: No  easy bruising/bleeding, No  abnormal lumps/bumps  Neurologic: No  weakness, No  Dizziness  Psychiatric: No  concerns with depression, No  concerns with anxiety  Exam:  BP (!) 155/85   Pulse 81   Ht '5\' 11"'$  (1.803 m)   Wt (!) 328 lb (148.8 kg)   BMI 45.75 kg/m   Constitutional: VS see above. General Appearance: alert, well-developed, well-nourished, NAD  Eyes: Normal lids and conjunctive, non-icteric sclera  Ears, Nose, Mouth, Throat: MMM, Normal external inspection ears/nares/mouth/lips/gums.  Neck: No masses, trachea midline.   Respiratory: Normal respiratory effort. no wheeze, no rhonchi, no rales  Cardiovascular: S1/S2 normal, no murmur, no rub/gallop auscultated. RRR.   Musculoskeletal: Gait normal. Symmetric and independent movement of all extremities  Neurological: Normal balance/coordination. No tremor.  Skin: warm, dry, intact.   Psychiatric: Normal judgment/insight. Normal mood and affect. Oriented x3.     ASSESSMENT/PLAN:   Uncontrolled type 2 diabetes mellitus with other circulatory complication, without long-term current use of insulin (HCC) - Plan: glucose blood test strip, Lancet Device MISC, Blood Glucose Calibration (ADVOCATE CONTROL SOLUTION) High LIQD, lovastatin (MEVACOR) 10 MG tablet, insulin NPH-regular Human (NOVOLIN 70/30) (70-30) 100 UNIT/ML injection  Uncontrolled hypertension - Much improved, will need manual checks only. Recently just started back on Lasix, plan to recheck in 1 month  Pure hypercholesterolemia - Requests specific statin medication, prescription written  Edema, lower extremity -  Resolved  Acute gastritis without hemorrhage, unspecified gastritis type - Possible gastritis/viral enteritis, patient did not seek medical care. Advised if similar symptoms recur, do not stop medicines without discussion/appointment    Patient Instructions  Call your insurance company and ask them to provide you with a list of their medications on formulary for insulin / diabetes. They should be able to mail you a list of what is covered.   Would recheck on your application for Toujeo savings.     Visit summary with medication list and pertinent instructions was printed for patient to review. All questions at time of visit were answered - patient instructed to contact office with any additional concerns. ER/RTC precautions were reviewed with the patient. Follow-up plan: Return in about 4 weeks (around 09/16/2016) for DM2 FOLLOWUP.

## 2016-08-19 NOTE — Patient Instructions (Addendum)
Call your insurance company and ask them to provide you with a list of their medications on formulary for insulin / diabetes. They should be able to mail you a list of what is covered.   Would recheck on your application for Toujeo savings.

## 2016-08-26 NOTE — Telephone Encounter (Signed)
Saw office note. I took patient's name off of the samples.closing encounter.

## 2016-09-02 ENCOUNTER — Encounter: Payer: Self-pay | Admitting: Osteopathic Medicine

## 2016-09-02 MED ORDER — CLOPIDOGREL BISULFATE 75 MG PO TABS: 75.0000 mg | ORAL_TABLET | Freq: Every day | ORAL | 3 refills | Status: DC

## 2016-09-16 ENCOUNTER — Ambulatory Visit: Payer: Medicare HMO | Admitting: Osteopathic Medicine

## 2016-09-30 ENCOUNTER — Ambulatory Visit (INDEPENDENT_AMBULATORY_CARE_PROVIDER_SITE_OTHER): Payer: Medicare HMO | Admitting: Osteopathic Medicine

## 2016-09-30 VITALS — BP 179/79 | HR 92 | Ht 72.0 in | Wt 333.0 lb

## 2016-09-30 DIAGNOSIS — E118 Type 2 diabetes mellitus with unspecified complications: Secondary | ICD-10-CM

## 2016-09-30 DIAGNOSIS — R600 Localized edema: Secondary | ICD-10-CM

## 2016-09-30 DIAGNOSIS — I10 Essential (primary) hypertension: Secondary | ICD-10-CM | POA: Diagnosis not present

## 2016-09-30 DIAGNOSIS — E1165 Type 2 diabetes mellitus with hyperglycemia: Secondary | ICD-10-CM

## 2016-09-30 LAB — POCT GLYCOSYLATED HEMOGLOBIN (HGB A1C): HEMOGLOBIN A1C: 14

## 2016-09-30 MED ORDER — FUROSEMIDE 40 MG PO TABS: 40.0000 mg | ORAL_TABLET | Freq: Two times a day (BID) | ORAL | 1 refills | Status: DC

## 2016-09-30 MED ORDER — INSULIN ASPART PROT & ASPART (70-30 MIX) 100 UNIT/ML ~~LOC~~ SUSP: 36.0000 [IU] | Freq: Two times a day (BID) | SUBCUTANEOUS | 3 refills | Status: DC

## 2016-09-30 MED ORDER — LOSARTAN POTASSIUM 100 MG PO TABS: 100.0000 mg | ORAL_TABLET | Freq: Every day | ORAL | 1 refills | Status: DC

## 2016-09-30 NOTE — Progress Notes (Signed)
HPI: Matthew Barton is a 65 y.o. male  who presents to [facility] today, 09/30/16,  for chief complaint of:  Chief Complaint  Patient presents with  . Follow-up    DIABETES     HTN: on 08/05/16, 190/90. 09/30/16 179/79 Home cuff  In office last visit: 213/97. Manual cuff by Dr. Loni Muse at that time: 155/85. Could faintly hear blood flow through the cuff at 200s range but audible pulse heard at about 155 Has been taking Lisinopril, HCT, Lasix. Lower extremity edema has essentially resolved. Due for lab recheck. No CP/SOB, no HA/VC.   DM2: Stopped the Metformin, concern for GI upset (was feeling bad for about a week). 70/30 twice per day. Fasting levels were down to 250s (down from 300-450s, max was almost 500) but now back up again since off Metformin and he has ony minimally titrated up on the Insulin. Urinating a lot but not too thirsty. Prefers to be on the 70/30 insulin for now - we initially tried to get Toujeo covered as I suspected he would need high dose basal insulin, however this was denied and patient did not call his insurance to obtain insulin formulary, did not pick up Lantus which was sent to this pharmacy, so he went back onto 70/30. On 34 Units bid.           Past medical, surgical, social and family history reviewed: Past Medical History:  Diagnosis Date  . Diabetes mellitus without complication ([place])   . Hyperlipidemia   . Hypertension   . Stroke [facility])    No past surgical history on file. Social History  Substance Use Topics  . Smoking status: Never Smoker  . Smokeless tobacco: Never Used  . Alcohol use No   No family history on file.   Current medication list and allergy/intolerance information reviewed:   Current Outpatient Prescriptions on File Prior to Visit  Medication Sig Dispense Refill  . Blood Glucose Calibration (ADVOCATE CONTROL SOLUTION) High LIQD Use with glucometer as directed 1 each 99  . blood glucose meter kit  and supplies KIT Dispense based on patient and insurance preference. Use up to four times daily as directed. ICD 10 Dx: E11.59. Please dispense lancets x100 refill 99, test strips x100 refill 99, control solution x1 refill 99, and sharps container 1 each 0  . clopidogrel (PLAVIX) 75 MG tablet Take 1 tablet (75 mg total) by mouth daily. 90 tablet 3  . furosemide (LASIX) 40 MG tablet Take 1 tablet (40 mg total) by mouth 2 (two) times daily. Start with 1/2 tablet (20 mg total) by mouth 2 (two) times daily for 3 - 5 days then increase to whole tablet dose. 60 tablet 1  . glucose blood test strip Use up to 4 times per day as directed with glucometer. Disp: 100. Refill x99 100 each 99  . hydrochlorothiazide (HYDRODIURIL) 25 MG tablet Take 1 tablet (25 mg total) by mouth daily. 30 tablet 1  . insulin aspart protamine- aspart (NOVOLOG MIX 70/30) (70-30) 100 UNIT/ML injection Inject into the skin.    Marland Kitchen insulin NPH-regular Human (NOVOLIN 70/30) (70-30) 100 UNIT/ML injection Inject 30 Units into the skin 2 (two) times daily with a meal. Please also dispense appropriate syringes X100 refill 99 and needles X100 refill 99 10 mL 3  . Lancet Device MISC Use up to 4 times per day as directed with glucometer. Disp: 100. Refill x99 100 each 99  . lisinopril (PRINIVIL,ZESTRIL) 40 MG tablet Take  1 tablet (40 mg total) by mouth daily. 30 tablet 1  . lovastatin (MEVACOR) 10 MG tablet Take 1 tablet (10 mg total) by mouth at bedtime. 30 tablet 6  . metFORMIN (GLUCOPHAGE) 1000 MG tablet Start at 1/2 tablet (500 mg) by mouth daily and increase as tolerated to goal dose of 1 tablet (1000 mg) twice daily 180 tablet 3  . naproxen (NAPROSYN) 500 MG tablet Take 1 tablet (500 mg total) by mouth 2 (two) times daily with a meal. Prn pain 60 tablet 3   No current facility-administered medications on file prior to visit.    Allergies  Allergen Reactions  . Statins       Review of Systems:  Constitutional: No recent  illness  HEENT: No  headache, no vision change  Cardiac: No  chest pain, No  pressure, No palpitations  Respiratory:  No  shortness of breath. No  Cough  Gastrointestinal: No  abdominal pain, no change on bowel habits  Musculoskeletal: No new myalgia/arthralgia  Skin: No  Rash  Hem/Onc: No  easy bruising/bleeding, No  abnormal lumps/bumps  Neurologic: No  weakness, No  Dizziness  Psychiatric: No  concerns with depression, No  concerns with anxiety  Exam:  BP (!) 179/79   Pulse 92   Ht 6' (1.829 m)   Wt (!) 333 lb (151 kg)   BMI 45.16 kg/m   Constitutional: VS see above. General Appearance: alert, well-developed, well-nourished, NAD  Eyes: Normal lids and conjunctive, non-icteric sclera  Ears, Nose, Mouth, Throat: MMM, Normal external inspection ears/nares/mouth/lips/gums.  Neck: No masses, trachea midline.   Respiratory: Normal respiratory effort. no wheeze, no rhonchi, no rales  Cardiovascular: S1/S2 normal, no murmur, no rub/gallop auscultated. RRR.   Musculoskeletal: Gait normal. Symmetric and independent movement of all extremities  Neurological: Normal balance/coordination. No tremor.  Skin: warm, dry, intact.   Psychiatric: Normal judgment/insight. Normal mood and affect. Oriented x3.   Results for orders placed or performed in visit on 09/30/16 (from the past 24 hour(s))  POCT HgB A1C     Status: None   Collection Time: 09/30/16  3:00 PM  Result Value Ref Range   Hemoglobin A1C 97.3   BASIC METABOLIC PANEL WITH GFR     Status: Abnormal   Collection Time: 09/30/16  3:37 PM  Result Value Ref Range   Sodium 133 (L) 135 - 146 mmol/L   Potassium 4.2 3.5 - 5.3 mmol/L   Chloride 90 (L) 98 - 110 mmol/L   CO2 28 20 - 31 mmol/L   Glucose, Bld 514 (HH) 65 - 99 mg/dL   BUN 38 (H) 7 - 25 mg/dL   Creat 1.99 (H) 0.70 - 1.25 mg/dL   Calcium 9.7 8.6 - 10.3 mg/dL   GFR, Est African American 40 (L) >=60 mL/min   GFR, Est Non African American 34 (L) >=60 mL/min    Narrative   Performed at:  [address], [address]                [place], [place] [address]      ASSESSMENT/PLAN:  09/30/16: Have had a good deal of difficulty getting this patient's blood sugars and blood pressure under control. At last visit, BP was 150s over 80s which was the best it has been in some time. Is elevated today. Due for recheck renal function on Lasix. Patient has also not titrated up  on insulin appropriately, and he has discontinued his metformin. 10/01/16: Renal function significantly decreased, sugars elevated. We'll bring the patient back for urinalysis, if positive for ketones may need to consider ER referral  Uncontrolled type 2 diabetes mellitus with complication, unspecified long term insulin use status ([place]) - Plan: POCT HgB A1C     Visit summary with medication list and pertinent instructions was printed for patient to review. All questions at time of visit were answered - patient instructed to contact office with any additional concerns. ER/RTC precautions were reviewed with the patient. Follow-up plan: Return in about 2 weeks (around 10/14/2016) for DIABETES FOLLOW-UP AND BLOOD PRESSURE RECHECK.

## 2016-10-01 ENCOUNTER — Ambulatory Visit (INDEPENDENT_AMBULATORY_CARE_PROVIDER_SITE_OTHER): Payer: Medicare HMO | Admitting: Osteopathic Medicine

## 2016-10-01 VITALS — BP 179/65 | HR 85

## 2016-10-01 DIAGNOSIS — E1100 Type 2 diabetes mellitus with hyperosmolarity without nonketotic hyperglycemic-hyperosmolar coma (NKHHC): Secondary | ICD-10-CM

## 2016-10-01 DIAGNOSIS — Z794 Long term (current) use of insulin: Secondary | ICD-10-CM | POA: Diagnosis not present

## 2016-10-01 DIAGNOSIS — N179 Acute kidney failure, unspecified: Secondary | ICD-10-CM

## 2016-10-01 DIAGNOSIS — I10 Essential (primary) hypertension: Secondary | ICD-10-CM

## 2016-10-01 DIAGNOSIS — E1165 Type 2 diabetes mellitus with hyperglycemia: Secondary | ICD-10-CM | POA: Diagnosis not present

## 2016-10-01 DIAGNOSIS — E1169 Type 2 diabetes mellitus with other specified complication: Secondary | ICD-10-CM | POA: Diagnosis not present

## 2016-10-01 DIAGNOSIS — IMO0002 Reserved for concepts with insufficient information to code with codable children: Secondary | ICD-10-CM

## 2016-10-01 DIAGNOSIS — R600 Localized edema: Secondary | ICD-10-CM

## 2016-10-01 DIAGNOSIS — R944 Abnormal results of kidney function studies: Secondary | ICD-10-CM | POA: Diagnosis not present

## 2016-10-01 LAB — POCT URINALYSIS DIPSTICK
BILIRUBIN UA: NEGATIVE
Glucose, UA: 500
Ketones, UA: NEGATIVE
Leukocytes, UA: NEGATIVE
NITRITE UA: NEGATIVE
PH UA: 6.5
PROTEIN UA: NEGATIVE
RBC UA: NEGATIVE
Spec Grav, UA: 1.01
Urobilinogen, UA: 1

## 2016-10-01 LAB — BASIC METABOLIC PANEL WITH GFR
BUN: 38 mg/dL — ABNORMAL HIGH (ref 7–25)
CALCIUM: 9.7 mg/dL (ref 8.6–10.3)
CO2: 28 mmol/L (ref 20–31)
Chloride: 90 mmol/L — ABNORMAL LOW (ref 98–110)
Creat: 1.99 mg/dL — ABNORMAL HIGH (ref 0.70–1.25)
GFR, EST AFRICAN AMERICAN: 40 mL/min — AB (ref >=60)
GFR, EST NON AFRICAN AMERICAN: 34 mL/min — AB (ref >=60)
GLUCOSE: 514 mg/dL — AB (ref 65–99)
Potassium: 4.2 mmol/L (ref 3.5–5.3)
SODIUM: 133 mmol/L — AB (ref 135–146)

## 2016-10-01 LAB — COMPLETE METABOLIC PANEL WITH GFR
ALT: 12 U/L (ref 9–46)
AST: 10 U/L (ref 10–35)
Albumin: 4.2 g/dL (ref 3.6–5.1)
Alkaline Phosphatase: 55 U/L (ref 40–115)
BUN: 42 mg/dL — AB (ref 7–25)
CALCIUM: 8.9 mg/dL (ref 8.6–10.3)
CHLORIDE: 93 mmol/L — AB (ref 98–110)
CO2: 26 mmol/L (ref 20–31)
Creat: 2.18 mg/dL — ABNORMAL HIGH (ref 0.70–1.25)
GFR, EST AFRICAN AMERICAN: 35 mL/min — AB (ref >=60)
GFR, Est Non African American: 31 mL/min — ABNORMAL LOW (ref >=60)
Glucose, Bld: 457 mg/dL — ABNORMAL HIGH (ref 65–99)
POTASSIUM: 4.5 mmol/L (ref 3.5–5.3)
Sodium: 131 mmol/L — ABNORMAL LOW (ref 135–146)
Total Bilirubin: 0.5 mg/dL (ref 0.2–1.2)
Total Protein: 7.3 g/dL (ref 6.1–8.1)

## 2016-10-01 LAB — OSMOLALITY: OSMOLALITY: 313 mosm/kg — AB (ref 278–305)

## 2016-10-01 MED ORDER — FUROSEMIDE 40 MG PO TABS: 20.0000 mg | ORAL_TABLET | Freq: Every day | ORAL | 1 refills | Status: DC

## 2016-10-01 MED ORDER — METOPROLOL SUCCINATE ER 50 MG PO TB24
50.0000 mg | ORAL_TABLET | Freq: Every day | ORAL | 1 refills | Status: DC
Start: 2016-10-01 — End: 2016-10-08

## 2016-10-01 MED ORDER — EMPAGLIFLOZIN 25 MG PO TABS: 25.0000 mg | ORAL_TABLET | Freq: Every day | ORAL | 1 refills | Status: DC

## 2016-10-01 NOTE — Progress Notes (Signed)
Dr. Lyn HollingsheadAlexander requested pt to come in for poct ua [facility]  Matthew [facility]Keytones are negative.

## 2016-10-01 NOTE — Patient Instructions (Addendum)
Plan:  We need to get additional blood work and possible follow-up in the hospital if labs reveal dangerous complications of high sugars. Please call us if you don't hear back about your results by tomorrow morning!    We need to decrease the dose of the Lasix (furosemide) since your kidney function was decreased, see printed prescription instructions  I have sent additional new medications:  For diabetes: Jardiance  For blood pressure: Metoprolol

## 2016-10-02 MED ORDER — EMPAGLIFLOZIN 25 MG PO TABS: 25.0000 mg | ORAL_TABLET | Freq: Every day | ORAL | 1 refills | Status: DC

## 2016-10-02 NOTE — Progress Notes (Signed)
HPI: Matthew Barton is a 65 y.o. male  who presents to [place] today, 10/02/16,  for chief complaint of: No chief complaint on file.   Pt in office 10/01/16 for UA given lab abnormality, see results below.   Past medical, surgical, social and family history reviewed: Patient Active Problem List   Diagnosis Date Noted  . Diabetic polyneuropathy associated with type 2 diabetes mellitus ([place]) 07/29/2016  . Uncontrolled hypertension 07/22/2016  . Edema, lower extremity 07/22/2016  . Chronic bilateral low back pain without sciatica 07/22/2016  . Type II diabetes mellitus, uncontrolled ([place]) 07/22/2016  . History of CVA (cerebrovascular accident) 07/22/2016   No past surgical history on file. Social History  Substance Use Topics  . Smoking status: Never Smoker  . Smokeless tobacco: Never Used  . Alcohol use No   No family history on file.   Current medication list and allergy/intolerance information reviewed:   Current Outpatient Prescriptions on File Prior to Visit  Medication Sig Dispense Refill  . Blood Glucose Calibration (ADVOCATE CONTROL SOLUTION) High LIQD Use with glucometer as directed 1 each 99  . blood glucose meter kit and supplies KIT Dispense based on patient and insurance preference. Use up to four times daily as directed. ICD 10 Dx: E11.59. Please dispense lancets x100 refill 99, test strips x100 refill 99, control solution x1 refill 99, and sharps container 1 each 0  . clopidogrel (PLAVIX) 75 MG tablet Take 1 tablet (75 mg total) by mouth daily. 90 tablet 3  . glucose blood test strip Use up to 4 times per day as directed with glucometer. Disp: 100. Refill x99 100 each 99  . hydrochlorothiazide (HYDRODIURIL) 25 MG tablet Take 1 tablet (25 mg total) by mouth daily. 30 tablet 1  . insulin aspart protamine- aspart (NOVOLOG MIX 70/30) (70-30) 100 UNIT/ML injection Inject 0.36 mLs (36 Units total) into the skin 2 (two) times daily with a  meal. Increase by 2 units, twice per week (38 and 38, then 40 and 40, and so on) 10 mL 3  . Lancet Device MISC Use up to 4 times per day as directed with glucometer. Disp: 100. Refill x99 100 each 99  . losartan (COZAAR) 100 MG tablet Take 1 tablet (100 mg total) by mouth daily. 30 tablet 1  . lovastatin (MEVACOR) 10 MG tablet Take 1 tablet (10 mg total) by mouth at bedtime. 30 tablet 6  . naproxen (NAPROSYN) 500 MG tablet Take 1 tablet (500 mg total) by mouth 2 (two) times daily with a meal. Prn pain 60 tablet 3   No current facility-administered medications on file prior to visit.    Allergies  Allergen Reactions  . Statins       Review of Systems:  Constitutional: No recent illness  HEENT: No  headache, no vision change  Cardiac: No  chest pain, No  pressure  Respiratory:  No  shortness of breath  Gastrointestinal: No  abdominal pain, no change on bowel habits  Musculoskeletal: No new myalgia/arthralgia  Neurologic: No  weakness, No  Dizziness  Exam:  BP (!) 179/65   Pulse 85   Constitutional: VS see above. General Appearance: alert, well-developed, well-nourished, NAD  Musculoskeletal: Gait normal. Symmetric and independent movement of all extremities  Neurological: Normal balance/coordination. No tremor.  Skin: warm, dry, intact.   Psychiatric: Normal judgment/insight. Normal mood and affect. Oriented x3.    Results for orders placed or performed in visit on 10/01/16 (from the past 72  hour(s))  POCT urinalysis dipstick     Status: None   Collection Time: 10/01/16  3:02 PM  Result Value Ref Range   Color, UA yellow    Clarity, UA clear    Glucose, UA 500    Bilirubin, UA neg    Ketones, UA neg    Spec Grav, UA 1.010    Blood, UA neg    pH, UA 6.5    Protein, UA neg    Urobilinogen, UA 1.0    Nitrite, UA neg    Leukocytes, UA Negative Negative    ASSESSMENT/PLAN:  10/01/16 Got lab work for further evaluation, given significantly elevated blood sugars  and decreased GFR, advised reduced dose of Lasix but after reviewing labs 10/02/2016 I think his acute kidney injury is probably also complicated by borderline HHNK, osmolarity is elevated though is less than 320, I don't have the facilities to do an ABG at this point but reassuring that ketones were negative, he needs fluid resuscitation and monitoring of renal function, I advised him to hold Lasix altogether, do not take the Jardiance either, I think he needs monitoring with fluid resuscitation given his cardiac history and uncontrolled BP, so I directed him to go to the ER  Diabetes mellitus with hyperosmolarity without hyperglycemic hyperosmolar nonketotic coma ([place])  Uncontrolled hypertension - Plan: furosemide (LASIX) 40 MG tablet  Edema, lower extremity - Plan: furosemide (LASIX) 40 MG tablet  Decreased GFR - Plan: POCT urinalysis dipstick  Uncontrolled type 2 diabetes mellitus with other specified complication, with long-term current use of insulin ([place]) - Plan: POCT urinalysis dipstick  AKI (acute kidney injury) [facility])    Patient Instructions printed 10/01/16  Plan:  We need to get additional blood work and possible follow-up in the hospital if labs reveal dangerous complications of high sugars. Please call us if you don't hear back about your results by tomorrow morning!    We need to decrease the dose of the Lasix (furosemide) since your kidney function was decreased, see printed prescription instructions  I have sent additional new medications:  For diabetes: Jardiance  For blood pressure: Metoprolol    Visit summary with medication list and pertinent instructions was printed for patient to review. All questions at time of visit were answered - patient instructed to contact office with any additional concerns. ER/RTC precautions were reviewed with the patient. Follow-up plan: No Follow-up on file.  Note: Total time spent 25 minutes, greater than 50% of the visit was spent  face-to-face counseling and coordinating care for the following: The primary encounter diagnosis was Diabetes mellitus with hyperosmolarity without hyperglycemic hyperosmolar nonketotic coma ([place]). Diagnoses of Uncontrolled hypertension, Edema, lower extremity, Decreased GFR, Uncontrolled type 2 diabetes mellitus with other specified complication, with long-term current use of insulin ([place]), and AKI (acute kidney injury) ([place]) were also pertinent to this visit.Marland Kitchen

## 2016-10-03 LAB — ACETONE

## 2016-10-03 LAB — KETONES, QUALITATIVE

## 2016-10-08 ENCOUNTER — Ambulatory Visit (INDEPENDENT_AMBULATORY_CARE_PROVIDER_SITE_OTHER): Payer: Medicare HMO | Admitting: Osteopathic Medicine

## 2016-10-08 ENCOUNTER — Encounter: Payer: Self-pay | Admitting: Osteopathic Medicine

## 2016-10-08 VITALS — BP 155/80 | HR 72 | Ht 72.0 in | Wt 342.0 lb

## 2016-10-08 DIAGNOSIS — G8929 Other chronic pain: Secondary | ICD-10-CM

## 2016-10-08 DIAGNOSIS — E118 Type 2 diabetes mellitus with unspecified complications: Secondary | ICD-10-CM | POA: Diagnosis not present

## 2016-10-08 DIAGNOSIS — M545 Low back pain, unspecified: Secondary | ICD-10-CM

## 2016-10-08 DIAGNOSIS — I10 Essential (primary) hypertension: Secondary | ICD-10-CM | POA: Diagnosis not present

## 2016-10-08 DIAGNOSIS — Z794 Long term (current) use of insulin: Secondary | ICD-10-CM

## 2016-10-08 LAB — POCT URINALYSIS DIPSTICK
Bilirubin, UA: NEGATIVE
Blood, UA: NEGATIVE
Glucose, UA: >1000
KETONES UA: NEGATIVE
LEUKOCYTES UA: NEGATIVE
Nitrite, UA: NEGATIVE
Urobilinogen, UA: 0.2
pH, UA: 6

## 2016-10-08 LAB — GLUCOSE, POCT (MANUAL RESULT ENTRY): POC Glucose: 294 mg/dl — AB (ref 70–99)

## 2016-10-08 MED ORDER — INSULIN ASPART 100 UNIT/ML ~~LOC~~ SOLN: 10.0000 [IU] | SUBCUTANEOUS | 11 refills | Status: DC

## 2016-10-08 MED ORDER — AMLODIPINE BESYLATE 10 MG PO TABS: 10.0000 mg | ORAL_TABLET | Freq: Every day | ORAL | 0 refills | Status: DC

## 2016-10-08 MED ORDER — CELECOXIB 200 MG PO CAPS: 200.0000 mg | ORAL_CAPSULE | Freq: Two times a day (BID) | ORAL | 1 refills | Status: DC

## 2016-10-08 MED ORDER — INSULIN GLARGINE 100 UNIT/ML ~~LOC~~ SOLN: 55.0000 [IU] | Freq: Every day | SUBCUTANEOUS | 11 refills | Status: DC

## 2016-10-08 MED ORDER — HYDROCHLOROTHIAZIDE 25 MG PO TABS: 25.0000 mg | ORAL_TABLET | Freq: Every day | ORAL | 1 refills | Status: DC

## 2016-10-08 MED ORDER — EMPAGLIFLOZIN 25 MG PO TABS
25.0000 mg | ORAL_TABLET | Freq: Every day | ORAL | 1 refills | Status: DC
Start: 2016-10-08 — End: 2017-04-30

## 2016-10-08 NOTE — Progress Notes (Signed)
HPI: Matthew Barton is a 65 y.o. male  who presents to [place] today, 10/08/16,  for chief complaint of:  Chief Complaint  Patient presents with  . Hospitalization Follow-up    Last week there was concern for worsening renal function and increasing osmolality on labs, borderline age 48 intake, was reluctant to administer fluids and concerned he may need admitted based on lab repeats. Advised patient to go to hospital for further evaluation, he wears hydrated with IV fluids in the emergency room and released home, kidney function was looking a little bit better but needs rechecked. Patient states that his fasting blood sugars have been running in the 200s, he is still taking the 7030 insulin and is up to 38 units twice per day.  Persistent back pain has also been an issue for this patient, recent lumbar x-rays do not show much of concern, no radicular symptoms or weakness that would warrant an MRI at this point. Patient thinks that if his back pain was under better control, he might be able to get more done in the way of exercise  Hypertension: Patient states that he would really rather be off of the metoprolol if all possible. States that it his greatly impacted his energy level and quality of life. States that he would rather "risk an early death than live like this"  Past medical, surgical, social and family history reviewed: Patient Active Problem List   Diagnosis Date Noted  . Diabetic polyneuropathy associated with type 2 diabetes mellitus ([place]) 07/29/2016  . Uncontrolled hypertension 07/22/2016  . Edema, lower extremity 07/22/2016  . Chronic bilateral low back pain without sciatica 07/22/2016  . Type II diabetes mellitus, uncontrolled ([place]) 07/22/2016  . History of CVA (cerebrovascular accident) 07/22/2016   No past surgical history on file. Social History  Substance Use Topics  . Smoking status: Never Smoker  . Smokeless tobacco: Never Used  .  Alcohol use No   No family history on file.   Current medication list and allergy/intolerance information reviewed:   Current Outpatient Prescriptions on File Prior to Visit  Medication Sig Dispense Refill  . Blood Glucose Calibration (ADVOCATE CONTROL SOLUTION) High LIQD Use with glucometer as directed 1 each 99  . blood glucose meter kit and supplies KIT Dispense based on patient and insurance preference. Use up to four times daily as directed. ICD 10 Dx: E11.59. Please dispense lancets x100 refill 99, test strips x100 refill 99, control solution x1 refill 99, and sharps container 1 each 0  . clopidogrel (PLAVIX) 75 MG tablet Take 1 tablet (75 mg total) by mouth daily. 90 tablet 3  . empagliflozin (JARDIANCE) 25 MG TABS tablet Take 25 mg by mouth daily. NOTE: PATIENT ADVISED TO HOLD THIS MEDICINE GIVEN REDUCED GFR 30 tablet 1  . furosemide (LASIX) 40 MG tablet Take 0.5 tablets (20 mg total) by mouth daily. Until kidney function rechecked 60 tablet 1  . glucose blood test strip Use up to 4 times per day as directed with glucometer. Disp: 100. Refill x99 100 each 99  . hydrochlorothiazide (HYDRODIURIL) 25 MG tablet Take 1 tablet (25 mg total) by mouth daily. 30 tablet 1  . insulin aspart protamine- aspart (NOVOLOG MIX 70/30) (70-30) 100 UNIT/ML injection Inject 0.36 mLs (36 Units total) into the skin 2 (two) times daily with a meal. Increase by 2 units, twice per week (38 and 38, then 40 and 40, and so on) 10 mL 3  . Lancet Device MISC  Use up to 4 times per day as directed with glucometer. Disp: 100. Refill x99 100 each 99  . losartan (COZAAR) 100 MG tablet Take 1 tablet (100 mg total) by mouth daily. 30 tablet 1  . lovastatin (MEVACOR) 10 MG tablet Take 1 tablet (10 mg total) by mouth at bedtime. 30 tablet 6  . metoprolol succinate (TOPROL-XL) 50 MG 24 hr tablet Take 1 tablet (50 mg total) by mouth daily. Take with or immediately following a meal. 30 tablet 1  . naproxen (NAPROSYN) 500 MG tablet  Take 1 tablet (500 mg total) by mouth 2 (two) times daily with a meal. Prn pain 60 tablet 3   No current facility-administered medications on file prior to visit.    Allergies  Allergen Reactions  . Statins        Review of Systems:  Constitutional: No recent illness  HEENT: No  headache, no vision change  Cardiac: No  chest pain, No  pressure  Respiratory:  No  shortness of breath. No  Cough  Gastrointestinal: No  abdominal pain, no change on bowel habits  Musculoskeletal: No new myalgia/arthralgia  Skin: No  Rash  Hem/Onc: No  easy bruising/bleeding, No  abnormal lumps/bumps  Neurologic: No  weakness, No  Dizziness, no delirium/confusion   Exam:  BP (!) 155/80   Pulse 72   Ht 6' (1.829 m)   Wt (!) 342 lb (155.1 kg)   BMI 46.38 kg/m pressure improved on manual recheck  Constitutional: VS see above. General Appearance: alert, well-developed, well-nourished, NAD  Eyes: Normal lids and conjunctive, non-icteric sclera  Ears, Nose, Mouth, Throat: MMM, Normal external inspection ears/nares/mouth/lips/gums.  Neck: No masses, trachea midline.   Respiratory: Normal respiratory effort. no wheeze, no rhonchi, no rales  Cardiovascular: S1/S2 normal, no murmur, no rub/gallop auscultated. RRR.   Musculoskeletal: Gait normal. Symmetric and independent movement of all extremities. Negative straight leg raise bilaterally.  Neurological: Normal balance/coordination. No tremor.  Skin: warm, dry, intact.   Psychiatric: Normal judgment/insight. Normal mood and affect. Oriented x3.    Results for orders placed or performed in visit on 10/08/16 (from the past 72 hour(s))  POCT glucose (manual entry)     Status: Abnormal   Collection Time: 10/08/16  3:44 PM  Result Value Ref Range   POC Glucose 294 (A) 70 - 99 mg/dl  POCT Urinalysis Dipstick     Status: None   Collection Time: 10/08/16  3:45 PM  Result Value Ref Range   Color, UA YELLOW    Clarity, UA CLEAR    Glucose,  UA >1,000    Bilirubin, UA NEGATIVE    Ketones, UA NEGATIVE    Spec Grav, UA <=1.005    Blood, UA NEGATIVE    pH, UA 6.0    Protein, UA TRACE    Urobilinogen, UA 0.2    Nitrite, UA NEGATIVE    Leukocytes, UA Negative Negative  COMPLETE METABOLIC PANEL WITH GFR     Status: Abnormal   Collection Time: 10/08/16  4:48 PM  Result Value Ref Range   Sodium 135 135 - 146 mmol/L   Potassium 5.0 3.5 - 5.3 mmol/L   Chloride 103 98 - 110 mmol/L   CO2 23 20 - 31 mmol/L   Glucose, Bld 249 (H) 65 - 99 mg/dL   BUN 31 (H) 7 - 25 mg/dL   Creat 1.77 (H) 0.70 - 1.25 mg/dL    Comment:   For patients > or = 65 years of age: The  upper reference limit for Creatinine is approximately 13% higher for people identified as African-American.      Total Bilirubin 0.4 0.2 - 1.2 mg/dL   Alkaline Phosphatase 56 40 - 115 U/L   AST 15 10 - 35 U/L   ALT 12 9 - 46 U/L   Total Protein 7.5 6.1 - 8.1 g/dL   Albumin 4.2 3.6 - 5.1 g/dL   Calcium 9.1 8.6 - 10.3 mg/dL   GFR, Est African American 46 (L) >=60 mL/min   GFR, Est Non African American 39 (L) >=60 mL/min  Osmolality     Status: Abnormal   Collection Time: 10/08/16  4:48 PM  Result Value Ref Range   Osmolality 307 (H) 278 - 305 mOsm/kg   Previous x-rays personally reviewed with the patient   ASSESSMENT/PLAN:   Adjusting basal/bolus insulin dosing which I think will hopefully work better than the 7030 the patient had preference for 7030 and we had problems getting him basal insulin such as Toujeo her Lantus in the past. He prefers vials rather than pens.  Type 2 diabetes mellitus with complication, with long-term current use of insulin ([facility]) - Plan: POCT glucose (manual entry), POCT Urinalysis Dipstick, empagliflozin (JARDIANCE) 25 MG TABS tablet, insulin glargine (LANTUS) 100 UNIT/ML injection, insulin aspart (NOVOLOG) 100 UNIT/ML injection, COMPLETE METABOLIC PANEL WITH GFR, Osmolality  Uncontrolled hypertension - Plan: hydrochlorothiazide  (HYDRODIURIL) 25 MG tablet, amLODipine (NORVASC) 10 MG tablet, COMPLETE METABOLIC PANEL WITH GFR, Osmolality  Chronic bilateral low back pain without sciatica - Plan: Ambulatory referral to Physical Therapy, COMPLETE METABOLIC PANEL WITH GFR, Osmolality    Patient Instructions  Changes we made today:  STOP   70/30 Insulin  Metoprolol  Naproxen  START   For BP: Amlodipine  For Diabetes:  Lantus (long-acting/daily) insulin - 55 Units daily then increase to 60 Units in 3-5 days  Novlolog (short-acting/mealtime) insulin - 10 units before large meals  For arthritis/back: Celebrex if we can get this covered, if not will send alternative)   We also sent referral to physical therapy to help up with back pain    After reviewing labs, see results, advised hold Lasix altogether until kidney function is better.  Visit summary with medication list and pertinent instructions was printed for patient to review. All questions at time of visit were answered - patient instructed to contact office with any additional concerns. ER/RTC precautions were reviewed with the patient. Follow-up plan: Return in about 1 week (around 10/15/2016) for DIABETES FOLLOW-UP.

## 2016-10-08 NOTE — Patient Instructions (Addendum)
Changes we made today:  STOP   70/30 Insulin  Metoprolol  Naproxen  START   For BP: Amlodipine  For Diabetes:  Lantus (long-acting/daily) insulin - 55 Units daily then increase to 60 Units in 3-5 days  Novlolog (short-acting/mealtime) insulin - 10 units before large meals  For arthritis/back: Celebrex if we can get this covered, if not will send alternative)   We also sent referral to physical therapy to help up with back pain

## 2016-10-09 LAB — OSMOLALITY: OSMOLALITY: 307 mosm/kg — AB (ref 278–305)

## 2016-10-09 LAB — COMPLETE METABOLIC PANEL WITH GFR
ALT: 12 U/L (ref 9–46)
AST: 15 U/L (ref 10–35)
Albumin: 4.2 g/dL (ref 3.6–5.1)
Alkaline Phosphatase: 56 U/L (ref 40–115)
BILIRUBIN TOTAL: 0.4 mg/dL (ref 0.2–1.2)
BUN: 31 mg/dL — ABNORMAL HIGH (ref 7–25)
CHLORIDE: 103 mmol/L (ref 98–110)
CO2: 23 mmol/L (ref 20–31)
Calcium: 9.1 mg/dL (ref 8.6–10.3)
Creat: 1.77 mg/dL — ABNORMAL HIGH (ref 0.70–1.25)
GFR, EST NON AFRICAN AMERICAN: 39 mL/min — AB (ref >=60)
GFR, Est African American: 46 mL/min — ABNORMAL LOW (ref >=60)
GLUCOSE: 249 mg/dL — AB (ref 65–99)
Potassium: 5 mmol/L (ref 3.5–5.3)
SODIUM: 135 mmol/L (ref 135–146)
TOTAL PROTEIN: 7.5 g/dL (ref 6.1–8.1)

## 2016-10-14 ENCOUNTER — Ambulatory Visit: Payer: Medicare HMO | Admitting: Osteopathic Medicine

## 2016-10-15 ENCOUNTER — Ambulatory Visit (INDEPENDENT_AMBULATORY_CARE_PROVIDER_SITE_OTHER): Payer: Medicare HMO | Admitting: Osteopathic Medicine

## 2016-10-15 ENCOUNTER — Encounter: Payer: Self-pay | Admitting: Osteopathic Medicine

## 2016-10-15 VITALS — BP 155/90

## 2016-10-15 DIAGNOSIS — E1165 Type 2 diabetes mellitus with hyperglycemia: Secondary | ICD-10-CM

## 2016-10-15 DIAGNOSIS — Z789 Other specified health status: Secondary | ICD-10-CM | POA: Insufficient documentation

## 2016-10-15 DIAGNOSIS — N179 Acute kidney failure, unspecified: Secondary | ICD-10-CM | POA: Diagnosis not present

## 2016-10-15 DIAGNOSIS — E1159 Type 2 diabetes mellitus with other circulatory complications: Secondary | ICD-10-CM

## 2016-10-15 DIAGNOSIS — I10 Essential (primary) hypertension: Secondary | ICD-10-CM

## 2016-10-15 DIAGNOSIS — IMO0002 Reserved for concepts with insufficient information to code with codable children: Secondary | ICD-10-CM

## 2016-10-15 DIAGNOSIS — Z794 Long term (current) use of insulin: Secondary | ICD-10-CM

## 2016-10-15 LAB — COMPLETE METABOLIC PANEL WITH GFR
ALK PHOS: 58 U/L (ref 40–115)
ALT: 13 U/L (ref 9–46)
AST: 12 U/L (ref 10–35)
Albumin: 4.4 g/dL (ref 3.6–5.1)
BUN: 22 mg/dL (ref 7–25)
CO2: 23 mmol/L (ref 20–31)
Calcium: 9.9 mg/dL (ref 8.6–10.3)
Chloride: 100 mmol/L (ref 98–110)
Creat: 1.51 mg/dL — ABNORMAL HIGH (ref 0.70–1.25)
GFR, Est African American: 55 mL/min — ABNORMAL LOW (ref >=60)
GFR, Est Non African American: 48 mL/min — ABNORMAL LOW (ref >=60)
GLUCOSE: 240 mg/dL — AB (ref 65–99)
POTASSIUM: 4.6 mmol/L (ref 3.5–5.3)
SODIUM: 136 mmol/L (ref 135–146)
TOTAL PROTEIN: 7.6 g/dL (ref 6.1–8.1)
Total Bilirubin: 0.5 mg/dL (ref 0.2–1.2)

## 2016-10-15 LAB — URINALYSIS
BILIRUBIN URINE: NEGATIVE
Hgb urine dipstick: NEGATIVE
Ketones, ur: NEGATIVE
Leukocytes, UA: NEGATIVE
Nitrite: NEGATIVE
PROTEIN: NEGATIVE
SPECIFIC GRAVITY, URINE: 1.026 (ref 1.001–1.035)
pH: 6 (ref 5.0–8.0)

## 2016-10-15 MED ORDER — NEBIVOLOL HCL 10 MG PO TABS: 10.0000 mg | ORAL_TABLET | Freq: Every day | ORAL | 3 refills | Status: DC

## 2016-10-15 NOTE — Progress Notes (Signed)
HPI: Matthew Barton is a 66 y.o. male  who presents to [facility] today, 10/15/16,  for chief complaint of:  Chief Complaint  Patient presents with  . Follow-up    DIABETES    Diabetes: 38 units 70/30 Insulin twice daily, Has not been able to get the Lantus and NovoLog approved by insurance. Are not sure what the holdup is with this process. No hypoglycemic symptoms. Fasting sugars are anywhere in the 150s to 230s. Postprandial sugars he is checking about 2 hours after meals, typically in the 230s to 250s.  Hypertension/cardiac: Is wearing compression stockings. Doing well on the amlodipine. We had to discontinue Lasix due to concern for renal function. No chest pain, pressure, shortness of breath.    Past medical, surgical, social and family history reviewed: Patient Active Problem List   Diagnosis Date Noted  . Diabetic polyneuropathy associated with type 2 diabetes mellitus ([place]) 07/29/2016  . Uncontrolled hypertension 07/22/2016  . Edema, lower extremity 07/22/2016  . Chronic bilateral low back pain without sciatica 07/22/2016  . Type II diabetes mellitus, uncontrolled ([place]) 07/22/2016  . History of CVA (cerebrovascular accident) 07/22/2016   No past surgical history on file. Social History  Substance Use Topics  . Smoking status: Never Smoker  . Smokeless tobacco: Never Used  . Alcohol use No   No family history on file.   Current medication list and allergy/intolerance information reviewed:   Current Outpatient Prescriptions on File Prior to Visit  Medication Sig Dispense Refill  . amLODipine (NORVASC) 10 MG tablet Take 1 tablet (10 mg total) by mouth daily. 90 tablet 0  . Blood Glucose Calibration (ADVOCATE CONTROL SOLUTION) High LIQD Use with glucometer as directed 1 each 99  . blood glucose meter kit and supplies KIT Dispense based on patient and insurance preference. Use up to four times daily as directed. ICD 10 Dx: E11.59. Please  dispense lancets x100 refill 99, test strips x100 refill 99, control solution x1 refill 99, and sharps container 1 each 0  . celecoxib (CELEBREX) 200 MG capsule Take 1 capsule (200 mg total) by mouth 2 (two) times daily. 60 capsule 1  . clopidogrel (PLAVIX) 75 MG tablet Take 1 tablet (75 mg total) by mouth daily. 90 tablet 3  . empagliflozin (JARDIANCE) 25 MG TABS tablet Take 25 mg by mouth daily. 30 tablet 1  . furosemide (LASIX) 40 MG tablet Take 0.5 tablets (20 mg total) by mouth daily. Until kidney function rechecked 60 tablet 1  . glucose blood test strip Use up to 4 times per day as directed with glucometer. Disp: 100. Refill x99 100 each 99  . hydrochlorothiazide (HYDRODIURIL) 25 MG tablet Take 1 tablet (25 mg total) by mouth daily. 90 tablet 1  . insulin aspart (NOVOLOG) 100 UNIT/ML injection Inject 10 Units into the skin as directed. 10 minutes prior to lunch and dinner 10 mL 11  . insulin glargine (LANTUS) 100 UNIT/ML injection Inject 0.55 mLs (55 Units total) into the skin at bedtime. Increase to 60 units after one week 10 mL 11  . Lancet Device MISC Use up to 4 times per day as directed with glucometer. Disp: 100. Refill x99 100 each 99  . losartan (COZAAR) 100 MG tablet Take 1 tablet (100 mg total) by mouth daily. 30 tablet 1  . lovastatin (MEVACOR) 10 MG tablet Take 1 tablet (10 mg total) by mouth at bedtime. 30 tablet 6   No current facility-administered medications on file prior to  visit.    Allergies  Allergen Reactions  . Statins       Review of Systems:  Constitutional: No recent illness  HEENT: No  headache, no vision change  Cardiac: No  chest pain, No  pressure, No palpitations    Respiratory:  No  shortness of breath. No  Cough  Neurologic: No  weakness, No  Dizziness   Exam:  BP (!) 155/90   Constitutional: VS see above. General Appearance: alert, well-developed, well-nourished, NAD  Respiratory: Normal respiratory effort. no wheeze, no rhonchi, no  rales  Cardiovascular: S1/S2 normal, no murmur, no rub/gallop auscultated. RRR.   Psychiatric: Normal judgment/insight. Normal mood and affect. Oriented x3.      ASSESSMENT/PLAN: [name] been keeping an eye on labs since there was some concern for hyperosmolality with hyperglycemia. Fasting blood sugars sound like they're doing better, kidney function needs to be monitored as well.  Uncontrolled hypertension - Plan: nebivolol (BYSTOLIC) 10 MG tablet, COMPLETE METABOLIC PANEL WITH GFR, Urinalysis  Uncontrolled type 2 diabetes mellitus with other circulatory complication, with long-term current use of insulin (HCC) - Plan: COMPLETE METABOLIC PANEL WITH GFR, Urinalysis  Beta-blocker intolerance - Significant fatigue on metoprolol, will try Bystolic  AKI (acute kidney injury) ([place]) - Diuretic use versus significant hyperglycemia/hyperosmolar state versus progression of diabetic kidney disease, holding Lasix, consider nephrology referral      All questions at time of visit were answered - patient instructed to contact office with any additional concerns. ER/RTC precautions were reviewed with the patient. Follow-up plan: Return for Follow-up blood pressure check dependent on labs, need to get insulin and new blood pressure medicin.

## 2016-10-16 ENCOUNTER — Telehealth: Payer: Self-pay

## 2016-10-16 NOTE — Telephone Encounter (Signed)
Spoke to patient spouse and stated that she will contact her pharmacy and have them to send over a PA request. Estelle Junehonda Annleigh Knueppel,CMA

## 2016-10-16 NOTE — Telephone Encounter (Signed)
I can't recall if I messaged you about his already but this patient has had some difficulty getting insulin covered: Lantus and novolog. Can we see if these need PA? Thanks!

## 2016-10-16 NOTE — Telephone Encounter (Signed)
Gave patient his lab results and he wants to know when he should follow up. Please see last OV note instructions below. Matthew Barton,CMA   Return for Follow-up blood pressure check dependent on labs, need to get insulin and new blood pressure medicin.   After Visit Summary (Printed 10/14/2016)

## 2016-10-19 ENCOUNTER — Ambulatory Visit (INDEPENDENT_AMBULATORY_CARE_PROVIDER_SITE_OTHER): Payer: Medicare HMO | Admitting: Physical Therapy

## 2016-10-19 ENCOUNTER — Encounter: Payer: Self-pay | Admitting: Physical Therapy

## 2016-10-19 DIAGNOSIS — M545 Low back pain, unspecified: Secondary | ICD-10-CM

## 2016-10-19 DIAGNOSIS — G8929 Other chronic pain: Secondary | ICD-10-CM

## 2016-10-19 DIAGNOSIS — M2560 Stiffness of unspecified joint, not elsewhere classified: Secondary | ICD-10-CM | POA: Diagnosis not present

## 2016-10-19 DIAGNOSIS — M2569 Stiffness of other specified joint, not elsewhere classified: Secondary | ICD-10-CM

## 2016-10-19 NOTE — Patient Instructions (Addendum)
Lower Trunk Rotation Stretch    Keeping back flat and feet together, rotate knees to left side. Hold __2-3__ seconds. Repeat to the other side.  Repeat __10__ times per set. Do _1___ sets per session. Do __2__ sessions per day.  Chair Sitting    Sit at edge of seat, spine straight, one leg extended. Put a hand on each thigh and bend forward from the hip, keeping spine straight. Allow hand on extended leg to reach toward toes. Support upper body with other arm. Hold _30-45__ seconds. Repeat __2_ times per session. Do _2__ sessions per day. Repeat on the other leg.   Quads / HF, Prone - wife can push/lean into leg until stretch is felt.     Lie face down, knees together. Grasp one ankle with same-side hand. Use towel if needed to reach. Gently pull foot toward buttock. Hold _30-45__ seconds. Repeat _2__ times per session. Do _2__ sessions per day.  WALKING  Walking is a great form of exercise to increase your strength, endurance and overall fitness.  A walking program can help you start slowly and gradually build endurance as you go.  Everyone's ability is different, so each person's starting point will be different.  You do not have to follow them exactly.  The are just samples. You should simply find out what's right for you and stick to that program.   In the beginning, you'll start off walking 2-3 times a day for short distances.  As you get stronger, you'll be walking further at just 1-2 times per day.  A. You Can Walk For A Certain Length Of Time Each Day    Walk 3 minutes 3 times per day. ( or walk for one song)   Increase 2 minutes every 2 days (3 times per day).  Work up to 25-30 minutes (1-2 times per day).   Example:   Day 1-2 3 minutes 3 times per day   Day 7-8 110 minutes 2-3 times per day   Day 13-14 20-21 minutes 1-2 times per day

## 2016-10-19 NOTE — Therapy (Signed)
[facility] [address] [place], [place], [address] Phone: [phone]   Fax:  [phone]  Physical Therapy Evaluation  Patient Details  Name: Matthew Barton MRN: [id number] Date of Birth: 09/08/1951 Referring Provider: Dr Sunnie NielsenNatalie Alexander  Encounter Date: 10/19/2016      PT End of Session - 10/19/16 1031    Visit Number 1   Number of Visits 8   Date for PT Re-Evaluation 11/16/16   PT Start Time 0937   PT Stop Time 1031   PT Time Calculation (min) 54 min   Activity Tolerance Patient tolerated treatment well      Past Medical History:  Diagnosis Date  . Diabetes mellitus without complication (HCC)   . Hyperlipidemia   . Hypertension   . Stroke [facility](HCC)     History reviewed. No pertinent surgical history.  There were no vitals filed for this visit.       Subjective Assessment - 10/19/16 0945    Subjective Pt reports he has had LBP for many yrs, has tried stetching and some exercises without a lot of success.  He wasn't able to complete all of these due ot his body size.  He has had a stoke a couple yrs ago and this affected the way he walks.  Prior to that he worked using heavy equipment and this tired his back out.    Pertinent History DM.    How long can you sit comfortably? no limites   How long can you stand comfortably? tolerates about 5', then 8/10 pain that takes his breath away.    How long can you walk comfortably? 100' then very bad pain.  Can do better with holding on to a cart.    Diagnostic tests x-rays some degeneration.    Patient Stated Goals walk a distance without pain, he is diabetic and wants to exercise to lose weight.    Currently in Pain? No/denies  no pain in sitting.  He has pain with walking and standing            [facility] PT Assessment - 10/19/16 0001      Assessment   Medical Diagnosis bilat LBP   Referring Provider Dr Sunnie NielsenNatalie Alexander   Onset Date/Surgical Date 10/19/14    Hand Dominance Right   Next MD Visit PRN, he will schedule   Prior Therapy not for his back, did have it after his stroke     Precautions   Precautions None     Balance Screen   Has the patient fallen in the past 6 months No   Has the patient had a decrease in activity level because of a fear of falling?  No   Is the patient reluctant to leave their home because of a fear of falling?  Yes     Home Environment   Living Environment Private residence   Living Arrangements Spouse/significant other   Home Access Stairs to enter  takes one step at a time, uses spouse assist.    Home Layout One level     Prior Function   Level of Independence Independent  has difficulty with lower body dressing due to his stroke   Vocation Retired   Leisure putzing around, work on his hot rod, play with dog     Observation/Other Assessments   Focus on Therapeutic Outcomes (FOTO)  56% limited     Posture/Postural Control   Posture/Postural Control Postural limitations   Postural Limitations Rounded Shoulders;Forward head;Decreased lumbar lordosis;Flexed  trunk  extra abdominal girth,      ROM / Strength   AROM / PROM / Strength AROM;Strength     AROM   AROM Assessment Site Lumbar   Lumbar Flexion 12" from floor, tight HS    Lumbar Extension decreased 50% with reports of low back tightness   Lumbar - Right Rotation decreased 50% had spasms in his back   Lumbar - Left Rotation decreased 50%, had back spasms     Strength   Strength Assessment Site Hip;Knee;Ankle;Lumbar   Right/Left Knee --  bilat WNL   Right/Left Ankle --  bilat WNL   Lumbar Flexion --  poor    Lumbar Extension --  fair      Flexibility   Soft Tissue Assessment /Muscle Length yes  bilat tight SKTC Lt> Rt    Hamstrings supine SLR Lt 45 degrees, Rt 57   Quadriceps prone knee flexion Lt 112, Rt 127   Piriformis tight Lt, slight on Rt      Palpation   Spinal mobility lumbar and lower thoracic spinal mobility WNL    Palpation comment tightness in bilat paraspinals, QLs and hip rotators, no tenderness                   [facility] Adult PT Treatment/Exercise - 10/19/16 0001      Self-Care   Self-Care Other Self-Care Comments   Other Self-Care Comments  walking program using time.      Exercises   Exercises Lumbar     Lumbar Exercises: Stretches   Active Hamstring Stretch 2 reps;30 seconds  seated FWD lean   Lower Trunk Rotation --  10 reps   Hip Flexor Stretch 2 reps;30 seconds  prone with quad stretch, wife assist                PT Education - 10/19/16 1040    Education provided Yes   Education Details HEP and walking program   Person(s) Educated Patient   Methods Explanation;Demonstration;Handout   Comprehension Returned demonstration;Verbalized understanding             PT Long Term Goals - 10/19/16 1049      PT LONG TERM GOAL #1   Title I with advanced HEP to include a walking program ( 11/16/16)    Time 4   Period Weeks   Status New     PT LONG TERM GOAL #2   Title increase bilat hamstring flexibility =/> 75 degrees ( 11/16/16)    Time 4   Period Weeks   Status New     PT LONG TERM GOAL #3   Title increase bilat prone quad flexion to have Lt within 5 degrees of Rt ( 11/16/16)    Time 4   Period Weeks   Status New     PT LONG TERM GOAL #4   Title demo improved lumbar ROM without having any muscle spasms ( 11/16/16)    Time 4   Period Weeks   Status New     PT LONG TERM GOAL #5   Title improve FOTO +/< 40% limited , CJ level ( 11/16/16)    Time 4   Period Weeks   Status New     Additional Long Term Goals   Additional Long Term Goals Yes     PT LONG TERM GOAL #6   Title ambulate in the community without having to lean on a cart and no more than 2/10 back pain ( 11/16/16)  Time 4   Period Weeks   Status New               Plan - 10/19/16 1046    Clinical Impression Statement 66 yo male presents with h/o back pain that has gotten  progressively worse.  He feels part of it is from the fact that he is walking differently since he had a stroke a couple years ago.  He wishes to start a formal exercise program to help him loose wieght and control his diabetes. He fatigues with physical activites, is weak in his core for his body frame, and is very tight in all the musculature in the back and  hips.     Rehab Potential Good   PT Frequency 2x / week   PT Duration 4 weeks   PT Treatment/Interventions Ultrasound;Moist Heat;Therapeutic exercise;Dry needling;Taping;Manual techniques;Neuromuscular re-education;Cryotherapy;Electrical Stimulation;Patient/family education   PT Next Visit Plan LE mobility exercises, manual work to low back.    Consulted and Agree with Plan of Care Patient;Family member/caregiver   Family Member Consulted wife present and assisting with one of the exercises.       Patient will benefit from skilled therapeutic intervention in order to improve the following deficits and impairments:  Postural dysfunction, Decreased strength, Pain, Increased muscle spasms, Decreased range of motion, Difficulty walking  Visit Diagnosis: Back stiffness - Plan: PT plan of care cert/re-cert  Chronic bilateral low back pain without sciatica - Plan: PT plan of care cert/re-cert     Problem List Patient Active Problem List   Diagnosis Date Noted  . Beta-blocker intolerance 10/15/2016  . AKI (acute kidney injury) (HCC) 10/15/2016  . Diabetic polyneuropathy associated with type 2 diabetes mellitus (HCC) 07/29/2016  . Uncontrolled hypertension 07/22/2016  . Edema, lower extremity 07/22/2016  . Chronic bilateral low back pain without sciatica 07/22/2016  . Type II diabetes mellitus, uncontrolled (HCC) 07/22/2016  . History of CVA (cerebrovascular accident) 07/22/2016    Roderic Scarce PT  10/19/2016, 10:57 AM  [facility] [address] [place], [place],  [address] Phone: [phone]   Fax:  [phone]  Name: Matthew Barton MRN: [id number] Date of Birth: [birth year]

## 2016-10-19 NOTE — Telephone Encounter (Signed)
Unless there is a change in his insurance which is a possibility since its the first of the year the Lantus never required a PA. They just didn't want to pay the copay

## 2016-10-20 NOTE — Telephone Encounter (Signed)
Okay. Is there any way that we can get him set up with prescription assistance through the manufacturer? I would really like him to be on long-acting insulin rather than the 70/30 he is currently taking, but I understand that cost is a factor.

## 2016-10-21 ENCOUNTER — Ambulatory Visit (INDEPENDENT_AMBULATORY_CARE_PROVIDER_SITE_OTHER): Payer: Medicare HMO | Admitting: Rehabilitative and Restorative Service Providers"

## 2016-10-21 ENCOUNTER — Encounter: Payer: Self-pay | Admitting: Rehabilitative and Restorative Service Providers"

## 2016-10-21 DIAGNOSIS — G8929 Other chronic pain: Secondary | ICD-10-CM | POA: Diagnosis not present

## 2016-10-21 DIAGNOSIS — M545 Low back pain, unspecified: Secondary | ICD-10-CM

## 2016-10-21 DIAGNOSIS — M2569 Stiffness of other specified joint, not elsewhere classified: Secondary | ICD-10-CM

## 2016-10-21 DIAGNOSIS — M2560 Stiffness of unspecified joint, not elsewhere classified: Secondary | ICD-10-CM

## 2016-10-21 NOTE — Patient Instructions (Addendum)
Backward Bend (Standing)   Gentle - no pain! Arch backward to make hollow of back deeper. Hold _2-3___ seconds. Repeat __2-3__ times per set.  Do _several ___ sessions per day.  Knee to Chest    Lying supine, bend knee to chest hold 10 sec _5__ times.alternate legs. Do __1_ times per day.  Abdominal Bracing With Pelvic Floor (Hook-Lying)    With neutral spine, tighten pelvic floor and abdominals sucking belly button to back bone. Hold 10 sec Repeat __10_ times. Do _several__ times a day. Progress to do this in sitting; standing and walking    Abdominal Bracing With Pelvic Floor (Hook-Lying)   With neutral spine, tighten pelvic floor and abdominals. Hold 10 seconds. Repeat __10_ times. Do _1__ times a day.   Hip External Rotation With Pillow: Transverse Plane Stability   With both knees bent. Slowly roll one knee out. Be sure pelvis does not rotate. Do _10__ times. Restabilize pelvis. Alternate legs. Do _1-2__ sets, _1__ times per day.  Pelvic Press    Place hands under belly between navel and pubic bone, palms up. Feel pressure on hands. Increase pressure on hands by pressing pelvis down. This is NOT a pelvic tilt. Hold _5 sec __ seconds. Relax. Repeat _10__ times.

## 2016-10-21 NOTE — Therapy (Signed)
[facility] [address] [place], [place], [address] Phone: [phone]   Fax:  [phone]  Physical Therapy Treatment  Patient Details  Name: Matthew Barton MRN: [id number] Date of Birth: 10/02/1951 Referring Provider: Dr Sunnie NielsenNatalie Alexander  Encounter Date: 10/21/2016      PT End of Session - 10/21/16 0849    Visit Number 2   Number of Visits 8   Date for PT Re-Evaluation 11/16/16   PT Start Time 0847   PT Stop Time 0940   PT Time Calculation (min) 53 min   Activity Tolerance Patient tolerated treatment well      Past Medical History:  Diagnosis Date  . Diabetes mellitus without complication (HCC)   . Hyperlipidemia   . Hypertension   . Stroke [facility](HCC)     History reviewed. No pertinent surgical history.  There were no vitals filed for this visit.      Subjective Assessment - 10/21/16 0850    Subjective Patient reports that he has some soreness from the exercises at home. Managed exercises with help of wife. The walking was the hard part. The walking makes the pain worse - that and standing in one spot.    Currently in Pain? Yes   Pain Score 0-No pain   Pain Location Back   Pain Orientation Lower   Pain Descriptors / Indicators Sore   Pain Type Chronic pain   Pain Radiating Towards soreness into the legs as well from stretches                          [facility] Adult PT Treatment/Exercise - 10/21/16 0001      Lumbar Exercises: Stretches   Passive Hamstring Stretch 3 reps;30 seconds   Single Knee to Chest Stretch 5 reps;10 seconds   Lower Trunk Rotation --  10 reps      Lumbar Exercises: Aerobic   Stationary Bike Nustep L5 x 5 min     Lumbar Exercises: Standing   Other Standing Lumbar Exercises gentle trunk extension 5 sec x 5      Lumbar Exercises: Supine   Ab Set 10 reps  10 sec - pelvic floor and abs    Clam 10 reps  w/core engaged alt legs      Lumbar Exercises: Prone   Other  Prone Lumbar Exercises pelvic press 5 sec x 10      Moist Heat Therapy   Number Minutes Moist Heat 20 Minutes   Moist Heat Location Lumbar Spine     Electrical Stimulation   Electrical Stimulation Location bilat lumbar spine    Electrical Stimulation Action IFC   Electrical Stimulation Parameters to tolerance   Electrical Stimulation Goals Pain                PT Education - 10/21/16 [id number]    Education provided Yes   Education Details HEP   Person(s) Educated Patient   Methods Explanation;Demonstration;Tactile cues;Verbal cues;Handout   Comprehension Verbalized understanding;Returned demonstration;Verbal cues required;Tactile cues required             PT Long Term Goals - 10/19/16 1049      PT LONG TERM GOAL #1   Title I with advanced HEP to include a walking program ( 11/16/16)    Time 4   Period Weeks   Status New     PT LONG TERM GOAL #2   Title increase bilat hamstring flexibility =/> 75 degrees ( 11/16/16)  Time 4   Period Weeks   Status New     PT LONG TERM GOAL #3   Title increase bilat prone quad flexion to have Lt within 5 degrees of Rt ( 11/16/16)    Time 4   Period Weeks   Status New     PT LONG TERM GOAL #4   Title demo improved lumbar ROM without having any muscle spasms ( 11/16/16)    Time 4   Period Weeks   Status New     PT LONG TERM GOAL #5   Title improve FOTO +/< 40% limited , CJ level ( 11/16/16)    Time 4   Period Weeks   Status New     Additional Long Term Goals   Additional Long Term Goals Yes     PT LONG TERM GOAL #6   Title ambulate in the community without having to lean on a cart and no more than 2/10 back pain ( 11/16/16)    Time 4   Period Weeks   Status New               Plan - 10/21/16 1191    Clinical Impression Statement Some soreness in back and legs from exercises at initial visit. Progressed with exercises with minimal difficulty. Good response to modalities.    Rehab Potential Good   PT Frequency 2x /  week   PT Duration 4 weeks   PT Treatment/Interventions Ultrasound;Moist Heat;Therapeutic exercise;Dry needling;Taping;Manual techniques;Neuromuscular re-education;Cryotherapy;Electrical Stimulation;Patient/family education   PT Next Visit Plan LE mobility exercises, manual work to low back.    Consulted and Agree with Plan of Care Family member/caregiver   Family Member Consulted wife present       Patient will benefit from skilled therapeutic intervention in order to improve the following deficits and impairments:  Postural dysfunction, Decreased strength, Pain, Increased muscle spasms, Decreased range of motion, Difficulty walking  Visit Diagnosis: Back stiffness  Chronic bilateral low back pain without sciatica     Problem List Patient Active Problem List   Diagnosis Date Noted  . Beta-blocker intolerance 10/15/2016  . AKI (acute kidney injury) (HCC) 10/15/2016  . Diabetic polyneuropathy associated with type 2 diabetes mellitus (HCC) 07/29/2016  . Uncontrolled hypertension 07/22/2016  . Edema, lower extremity 07/22/2016  . Chronic bilateral low back pain without sciatica 07/22/2016  . Type II diabetes mellitus, uncontrolled (HCC) 07/22/2016  . History of CVA (cerebrovascular accident) 07/22/2016    Kennetha Pearman Rober Minion PT, MPH  10/21/2016, 9:34 AM  [facility] [address] [place], [place], [address] Phone: [phone]   Fax:  [phone]  Name: Matthew Barton MRN: [id number] Date of Birth: 1951-07-30

## 2016-10-21 NOTE — Telephone Encounter (Signed)
Noted. Thanks.  Patient should follow-up in the office no later than 2 months as long as he is doing well and no concerns about insulin administration

## 2016-10-21 NOTE — Telephone Encounter (Signed)
Printed off patient assistance forms and mailed to the patient. Called and left a message on the patient's vm to expect the forms in the mail and to mail them to appropriate

## 2016-10-22 NOTE — Telephone Encounter (Signed)
Patient notified

## 2016-10-26 ENCOUNTER — Ambulatory Visit (INDEPENDENT_AMBULATORY_CARE_PROVIDER_SITE_OTHER): Payer: Medicare HMO | Admitting: Rehabilitative and Restorative Service Providers"

## 2016-10-26 ENCOUNTER — Encounter: Payer: Self-pay | Admitting: Rehabilitative and Restorative Service Providers"

## 2016-10-26 DIAGNOSIS — G8929 Other chronic pain: Secondary | ICD-10-CM | POA: Diagnosis not present

## 2016-10-26 DIAGNOSIS — M545 Low back pain, unspecified: Secondary | ICD-10-CM

## 2016-10-26 DIAGNOSIS — M2560 Stiffness of unspecified joint, not elsewhere classified: Secondary | ICD-10-CM | POA: Diagnosis not present

## 2016-10-26 DIAGNOSIS — M2569 Stiffness of other specified joint, not elsewhere classified: Secondary | ICD-10-CM

## 2016-10-26 NOTE — Therapy (Addendum)
[place], [place], [address] Phone: [phone]   Fax:  [phone]  Physical Therapy Treatment  Patient Details  Name: Matthew Barton MRN: [id number] Date of Birth: [birth year] Referring Provider: Dr Emeterio Reeve  Encounter Date: 10/26/2016      PT End of Session - 10/26/16 0850    Visit Number 3   Number of Visits 8   Date for PT Re-Evaluation 11/16/16   PT Start Time 8938   PT Stop Time 0938   PT Time Calculation (min) 51 min   Activity Tolerance Patient tolerated treatment well      Past Medical History:  Diagnosis Date  . Diabetes mellitus without complication ([place])   . Hyperlipidemia   . Hypertension   . Stroke [facility])     History reviewed. No pertinent surgical history.  There were no vitals filed for this visit.      Subjective Assessment - 10/26/16 0852    Subjective Patient reports that he is not sleeping well but that this is the norm for him. He has periods of time when he sleeps somewhat better but seems to be having more difficulty again. Has a TENS unit but is not using that at home. Got electrodes but has not tried to use the unit again. Having difficulty with walking. Can't even make 3 minutes - pain is severe and takes his breath away.    Currently in Pain? Yes   Pain Score 8    Pain Location Back   Pain Orientation Lower   Pain Type Chronic pain   Pain Onset More than a month ago   Pain Frequency Constant            [facility] PT Assessment - 10/26/16 0001      Assessment   Medical Diagnosis bilat LBP   Referring Provider Dr Emeterio Reeve   Onset Date/Surgical Date 10/19/14   Hand Dominance Right   Next MD Visit PRN, he will schedule   Prior Therapy not for his back, did have it after his stroke     AROM   AROM Assessment Site Lumbar  discomfort with all trunk mobility   Lumbar Flexion ~ 14 in from floor    Lumbar Extension decreased 50% with  reports of low back tightness   Lumbar - Right Rotation decreased 50% had spasms in his back   Lumbar - Left Rotation decreased 50%, had back spasms     Flexibility   Soft Tissue Assessment /Muscle Length yes  bilat tight SKTC Lt> Rt    Hamstrings supine SLR Lt 62 degrees, Rt 74   Quadriceps prone knee flexion Lt 109, Rt 115   Piriformis tight Lt, slight on Rt      Palpation   Palpation comment tightness in bilat paraspinals, QLs and hip rotators, no tenderness                     [facility] Adult PT Treatment/Exercise - 10/26/16 0001      Lumbar Exercises: Stretches   Passive Hamstring Stretch 3 reps;30 seconds   Single Knee to Chest Stretch 3 reps;10 seconds   Lower Trunk Rotation --  10 reps      Lumbar Exercises: Aerobic   Stationary Bike L5 x 4 min      Lumbar Exercises: Standing   Scapular Retraction Strengthening;Both;20 reps  core engaged   Theraband Level (Scapular Retraction) Level 3 (Green)   Row Strengthening;Both;20 reps  core  engaged   Theraband Level Occupational psychologist) Level 3 (Green)   Shoulder Extension Strengthening;Both;20 reps  core engaged   Theraband Level (Shoulder Extension) Level 3 (Green)   Other Standing Lumbar Exercises gentle trunk extension 5 sec x 5    Other Standing Lumbar Exercises walking ~60 feet circle around gym      Lumbar Exercises: Supine   Ab Set 10 reps  10 sec - pelvic floor and abs    Clam 10 reps  w/core engaged alt legs      Lumbar Exercises: Prone   Other Prone Lumbar Exercises pelvic press 5 sec x 10    Other Prone Lumbar Exercises prone press with toe in WB lifting knee 2-3 sec hold x 10 each LE alternating      Moist Heat Therapy   Number Minutes Moist Heat 20 Minutes   Moist Heat Location Lumbar Spine     Electrical Stimulation   Electrical Stimulation Location bilat lumbar spine    Electrical Stimulation Action IFC   Electrical Stimulation Parameters to tolerance   Electrical Stimulation Goals Pain     Manual  Therapy   Manual therapy comments prone   Joint Mobilization CPA mobilization general through the lumbar spine    Soft tissue mobilization working through bilat lumbar musculature    Myofascial Release bilat lumbar                PT Education - 10/26/16 0906    Education provided Yes   Education Details HEP   Person(s) Educated Patient   Methods Explanation;Demonstration;Tactile cues;Verbal cues;Handout   Comprehension Verbalized understanding;Returned demonstration;Verbal cues required;Tactile cues required             PT Long Term Goals - 10/26/16 0856      PT LONG TERM GOAL #1   Title I with advanced HEP to include a walking program ( 11/16/16)    Time 4   Period Weeks   Status On-going     PT LONG TERM GOAL #2   Title increase bilat hamstring flexibility =/> 75 degrees ( 11/16/16)    Time 4   Period Weeks   Status On-going     PT LONG TERM GOAL #3   Title increase bilat prone quad flexion to have Lt within 5 degrees of Rt ( 11/16/16)    Time 4   Period Weeks   Status On-going     PT LONG TERM GOAL #4   Title demo improved lumbar ROM without having any muscle spasms ( 11/16/16)    Time 4   Period Weeks   Status On-going     PT LONG TERM GOAL #5   Title improve FOTO +/< 40% limited , CJ level ( 11/16/16)    Time 4   Period Weeks   Status On-going     PT LONG TERM GOAL #6   Title ambulate in the community without having to lean on a cart and no more than 2/10 back pain ( 11/16/16)    Time 4   Period Weeks   Status On-going               Plan - 10/26/16 0850    Clinical Impression Statement Not sleeping well. Working on exercises at home but pain has not changed. Added exercises for lumbar stabilization. Improving hamstring flexibility. No goals accomplished to date    PT Frequency 2x / week   PT Duration 4 weeks   PT Treatment/Interventions Ultrasound;Moist Heat;Therapeutic exercise;Dry needling;Taping;Manual techniques;Neuromuscular  re-education;Cryotherapy;Electrical Stimulation;Patient/family education   PT Next Visit Plan LE mobility exercises, core stabilization, manual work to low back.   Consulted and Agree with Plan of Care Family member/caregiver   Family Member Consulted wife present       Patient will benefit from skilled therapeutic intervention in order to improve the following deficits and impairments:  Postural dysfunction, Decreased strength, Pain, Increased muscle spasms, Decreased range of motion, Difficulty walking  Visit Diagnosis: Back stiffness  Chronic bilateral low back pain without sciatica     Problem List Patient Active Problem List   Diagnosis Date Noted  . Beta-blocker intolerance 10/15/2016  . AKI (acute kidney injury) ([place]) 10/15/2016  . Diabetic polyneuropathy associated with type 2 diabetes mellitus ([place]) 07/29/2016  . Uncontrolled hypertension 07/22/2016  . Edema, lower extremity 07/22/2016  . Chronic bilateral low back pain without sciatica 07/22/2016  . Type II diabetes mellitus, uncontrolled ([place]) 07/22/2016  . History of CVA (cerebrovascular accident) 07/22/2016    Damaya Channing Nilda Simmer PT, MPH  10/26/2016, 9:36 AM  [facility] [place], [place], [address] Phone: [phone]   Fax:  [phone]  Name: Hobie Kohles MRN: [id number] Date of Birth: [birth year]   PHYSICAL THERAPY DISCHARGE SUMMARY  Visits from Start of Care: 3  Current functional level related to goals / functional outcomes: See last progress note for discharge status    Remaining deficits: Unknown   Education / Equipment: HEP  Plan: Patient agrees to discharge.  Patient goals were partially met. Patient is being discharged due to not returning since the last visit.  ?????    Elaynah Virginia P. Helene Kelp PT, MPH 12/25/16 9:25 AM

## 2016-10-26 NOTE — Patient Instructions (Addendum)
Bridging    Slowly raise buttocks from floor, keeping stomach tight. Repeat __10__ times per set. Do _1-2___ sets per session. Do ___1_ sessions per day.     Resisted External Rotation: in Neutral - Bilateral   PALMS UP Sit or stand, tubing in both hands, elbows at sides, bent to 90, forearms forward. Pinch shoulder blades together and rotate forearms out. Keep elbows at sides. Repeat __10__ times per set. Do _2-3___ sets per session. Do _2-3___ sessions per day.   Low Row: Standing   Face anchor, feet shoulder width apart. Palms up, pull arms back, squeezing shoulder blades together. Repeat 10__ times per set. Do 2-3__ sets per session. Do 2-3__ sessions per week. Anchor Height: Waist   Strengthening: Resisted Extension   Hold tubing in right hand, arm forward. Pull arm back, elbow straight. Repeat _10___ times per set. Do 2-3____ sets per session. Do 2-3____ sessions per day.   Knee raise    Lying on stomach. Tighten core; toe on surface; lift knee  Hold 2-3 sec x 10 alternating legs

## 2016-10-28 ENCOUNTER — Encounter: Payer: Self-pay | Admitting: Physical Therapy

## 2016-10-29 ENCOUNTER — Other Ambulatory Visit: Payer: Self-pay | Admitting: Osteopathic Medicine

## 2016-10-29 DIAGNOSIS — I10 Essential (primary) hypertension: Secondary | ICD-10-CM

## 2016-11-02 ENCOUNTER — Encounter: Payer: Medicare HMO | Admitting: Physical Therapy

## 2016-11-04 ENCOUNTER — Encounter: Payer: Medicare HMO | Admitting: Physical Therapy

## 2016-11-28 ENCOUNTER — Other Ambulatory Visit: Payer: Self-pay | Admitting: Osteopathic Medicine

## 2016-11-28 DIAGNOSIS — I10 Essential (primary) hypertension: Secondary | ICD-10-CM

## 2017-01-20 ENCOUNTER — Encounter: Payer: Self-pay | Admitting: Osteopathic Medicine

## 2017-01-20 ENCOUNTER — Ambulatory Visit (INDEPENDENT_AMBULATORY_CARE_PROVIDER_SITE_OTHER): Payer: Medicare HMO | Admitting: Osteopathic Medicine

## 2017-01-20 VITALS — BP 155/95 | HR 73 | Wt 340.0 lb

## 2017-01-20 DIAGNOSIS — Z794 Long term (current) use of insulin: Secondary | ICD-10-CM

## 2017-01-20 DIAGNOSIS — Z8673 Personal history of transient ischemic attack (TIA), and cerebral infarction without residual deficits: Secondary | ICD-10-CM

## 2017-01-20 DIAGNOSIS — E1159 Type 2 diabetes mellitus with other circulatory complications: Secondary | ICD-10-CM

## 2017-01-20 DIAGNOSIS — N183 Chronic kidney disease, stage 3 unspecified: Secondary | ICD-10-CM

## 2017-01-20 DIAGNOSIS — E1165 Type 2 diabetes mellitus with hyperglycemia: Secondary | ICD-10-CM

## 2017-01-20 DIAGNOSIS — I10 Essential (primary) hypertension: Secondary | ICD-10-CM

## 2017-01-20 DIAGNOSIS — IMO0002 Reserved for concepts with insufficient information to code with codable children: Secondary | ICD-10-CM

## 2017-01-20 LAB — COMPLETE METABOLIC PANEL WITH GFR
ALT: 14 U/L (ref 9–46)
AST: 14 U/L (ref 10–35)
Albumin: 4.2 g/dL (ref 3.6–5.1)
Alkaline Phosphatase: 61 U/L (ref 40–115)
BUN: 17 mg/dL (ref 7–25)
CALCIUM: 9.9 mg/dL (ref 8.6–10.3)
CHLORIDE: 101 mmol/L (ref 98–110)
CO2: 27 mmol/L (ref 20–31)
CREATININE: 1.33 mg/dL — AB (ref 0.70–1.25)
GFR, EST AFRICAN AMERICAN: 64 mL/min (ref >=60)
GFR, Est Non African American: 56 mL/min — ABNORMAL LOW (ref >=60)
Glucose, Bld: 220 mg/dL — ABNORMAL HIGH (ref 65–99)
Potassium: 4.7 mmol/L (ref 3.5–5.3)
Sodium: 140 mmol/L (ref 135–146)
TOTAL PROTEIN: 7.4 g/dL (ref 6.1–8.1)
Total Bilirubin: 0.4 mg/dL (ref 0.2–1.2)

## 2017-01-20 LAB — POCT GLYCOSYLATED HEMOGLOBIN (HGB A1C): HEMOGLOBIN A1C: 11.1

## 2017-01-20 MED ORDER — LOSARTAN POTASSIUM 100 MG PO TABS: 100.0000 mg | ORAL_TABLET | Freq: Every day | ORAL | 3 refills | Status: DC

## 2017-01-20 MED ORDER — LOVASTATIN 10 MG PO TABS: 10.0000 mg | ORAL_TABLET | Freq: Every day | ORAL | 3 refills | Status: DC

## 2017-01-20 MED ORDER — NEBIVOLOL HCL 10 MG PO TABS: 10.0000 mg | ORAL_TABLET | Freq: Every day | ORAL | 3 refills | Status: DC

## 2017-01-20 MED ORDER — FUROSEMIDE 20 MG PO TABS: 20.0000 mg | ORAL_TABLET | Freq: Two times a day (BID) | ORAL | 1 refills | Status: DC

## 2017-01-20 NOTE — Patient Instructions (Addendum)
Plan:  For Diabetes  Continue Empagliflozin as you are taking it  Measure fasting blood sugar every day: goal for now is to get this to 80-130  Start at 60 Units and increase daily Toujeo dose by 3 units at a time, twice per week, until fasting sugars are consistently 80-130, then continue at that dose  Plan to recheck A1C in 3 months  Plan to follow-up in the office sooner if you experience low sugars   Plan to follow-up in the office sooner if your fasting sugars are consistently high  Bring all sugar readings with you to your office visits  For Blood Pressure / Swelling  Starting Lasix aka Furosemide 20 mg twice daily  Checking labs today also, and will recheck labs in one week, too    Medications:  Toujeo, Bystolic, Celebrex - check with your pharmacy about coverage  If affordable, get these!  If not, ask pharmacy to contact our office about prior authorization, and call us so we know to expect that from the pharmacy

## 2017-01-20 NOTE — Progress Notes (Signed)
HPI: Matthew Barton is a 66 y.o. male  who presents to [facility] today, 01/20/17,  for chief complaint of:  Chief Complaint  Patient presents with  . Diabetes   DM2 Insulin: Prescribed Lantus 60 units qhs + Novolog 10 units pre-lunch/dinner Has not been taking these [place] may be covered, needs Rx  Still taking 70/30 at 46 units twice per day  Metformin intolerant  HTN Rx: Max on Amlodipine, Losartan. Reasonable dose HCTZ. Not taking Bystolic - never got this filled  HLD Hx statin intolerance Diet is a problem Rx: Lovastatin   Hx CVA  On Plavix No new weakness/speech/vision changes  Back Pain Not taking Celebrex - never got this filled   Labs 10/2016: Cr better but still CKD III, GFR 55.  BNP previously normal.   Other today: C/o mild LE swelling bilaterally, nonpainful, no SOB or CP  Obstacles to medications: insurance coverage, prioritizes $ to wife's medications rather than his. Poor diet d/t wife's food intolerances is also an issue.      Past medical history, surgical history, social history and family history reviewed.  Patient Active Problem List   Diagnosis Date Noted  . Beta-blocker intolerance 10/15/2016  . AKI (acute kidney injury) ([place]) 10/15/2016  . Diabetic polyneuropathy associated with type 2 diabetes mellitus ([place]) 07/29/2016  . Uncontrolled hypertension 07/22/2016  . Edema, lower extremity 07/22/2016  . Chronic bilateral low back pain without sciatica 07/22/2016  . Type II diabetes mellitus, uncontrolled ([place]) 07/22/2016  . History of CVA (cerebrovascular accident) 07/22/2016    Current medication list and allergy/intolerance information reviewed.   Current Outpatient Prescriptions on File Prior to Visit  Medication Sig Dispense Refill  . amLODipine (NORVASC) 10 MG tablet TAKE ONE TABLET BY MOUTH ONCE DAILY 90 tablet 0  . Blood Glucose Calibration (ADVOCATE CONTROL SOLUTION) High LIQD Use with  glucometer as directed 1 each 99  . blood glucose meter kit and supplies KIT Dispense based on patient and insurance preference. Use up to four times daily as directed. ICD 10 Dx: E11.59. Please dispense lancets x100 refill 99, test strips x100 refill 99, control solution x1 refill 99, and sharps container 1 each 0  . celecoxib (CELEBREX) 200 MG capsule Take 1 capsule (200 mg total) by mouth 2 (two) times daily. 60 capsule 1  . clopidogrel (PLAVIX) 75 MG tablet Take 1 tablet (75 mg total) by mouth daily. 90 tablet 3  . empagliflozin (JARDIANCE) 25 MG TABS tablet Take 25 mg by mouth daily. 30 tablet 1  . glucose blood test strip Use up to 4 times per day as directed with glucometer. Disp: 100. Refill x99 100 each 99  . hydrochlorothiazide (HYDRODIURIL) 25 MG tablet Take 1 tablet (25 mg total) by mouth daily. 90 tablet 1  . insulin aspart (NOVOLOG) 100 UNIT/ML injection Inject 10 Units into the skin as directed. 10 minutes prior to lunch and dinner 10 mL 11  . insulin glargine (LANTUS) 100 UNIT/ML injection Inject 0.55 mLs (55 Units total) into the skin at bedtime. Increase to 60 units after one week 10 mL 11  . Lancet Device MISC Use up to 4 times per day as directed with glucometer. Disp: 100. Refill x99 100 each 99  . losartan (COZAAR) 100 MG tablet TAKE ONE TABLET BY MOUTH ONCE DAILY 30 tablet 1  . lovastatin (MEVACOR) 10 MG tablet Take 1 tablet (10 mg total) by mouth at bedtime. 30 tablet 6  . nebivolol (BYSTOLIC) 10  MG tablet Take 1 tablet (10 mg total) by mouth daily. 30 tablet 3   No current facility-administered medications on file prior to visit.    Allergies  Allergen Reactions  . Statins       Review of Systems:  Constitutional: No recent illness  HEENT: No  headache, no vision change   Cardiac: No  chest pain, No  pressure, No palpitations  Respiratory:  No  shortness of breath. No  Cough  Gastrointestinal: No  abdominal pain, no change on bowel habits  Musculoskeletal:  No new myalgia/arthralgia  Skin: No  Rash  Neurologic: No  weakness, No  Dizziness   Exam:  BP (!) 155/95   Pulse 73   Wt (!) 340 lb (154.2 kg)   BMI 46.11 kg/m                                                                                  Constitutional: VS see above. General Appearance: alert, well-developed, well-nourished, NAD  Eyes: Normal lids and conjunctive, non-icteric sclera  Ears, Nose, Mouth, Throat: MMM, Normal external inspection ears/nares/mouth/lips/gums.  Neck: No masses, trachea midline.   Respiratory: Normal respiratory effort. no wheeze, no rhonchi, no rales  Cardiovascular: S1/S2 normal, no murmur, no rub/gallop auscultated. RRR. +1 LE edema. No JVD.   Musculoskeletal: Gait normal. Symmetric and independent movement of all extremities  Neurological: Normal balance/coordination. No tremor.  Skin: warm, dry, intact.   Psychiatric: Normal judgment/insight. Normal mood and affect. Oriented x3.    Recent Results (from the past 2160 hour(s))  POCT HgB A1C     Status: None   Collection Time: 01/20/17 10:22 AM  Result Value Ref Range   Hemoglobin A1C 11.1      ASSESSMENT/PLAN:   Discussed solutions to obstacles for medications: need to insist on prior auth from pharmacy and insurance, drug reps for financial assistance  Discussed significant risk of complications if BP and DM2 are not well-controlled - particularly the risk MI/CVA, renal failure  Prior auth on insulin, Bystolic and Celebrex?   Trial add Lasix for edema and BP control w/ close lab monitoring   Uncontrolled type 2 diabetes mellitus with other circulatory complication, with long-term current use of insulin ([facility]) - Plan: POCT HgB A1C, lovastatin (MEVACOR) 10 MG tablet, COMPLETE METABOLIC PANEL WITH GFR  Uncontrolled hypertension - Plan: losartan (COZAAR) 100 MG tablet, nebivolol (BYSTOLIC) 10 MG tablet, COMPLETE METABOLIC PANEL WITH GFR  History of CVA (cerebrovascular  accident)  CKD (chronic kidney disease), stage III    Patient Instructions  Plan:  For Diabetes  Continue Empagliflozin as you are taking it  Measure fasting blood sugar every day: goal for now is to get this to 80-130  Start at 60 Units and increase daily Toujeo dose by 3 units at a time, twice per week, until fasting sugars are consistently 80-130, then continue at that dose  Plan to recheck A1C in 3 months  Plan to follow-up in the office sooner if you experience low sugars   Plan to follow-up in the office sooner if your fasting sugars are consistently high  Bring all sugar readings with you to your office visits  For Blood  Pressure / Swelling  Starting Lasix aka Furosemide 20 mg twice daily  Checking labs today also, and will recheck labs in one week, too    Medications:  Toujeo, Bystolic, Celebrex - check with your pharmacy about coverage  If affordable, get these!  If not, ask pharmacy to contact our office about prior authorization, and call us so we know to expect that from the pharmacy     Follow-up plan: Return in about 1 week (around 01/27/2017) for blood pressure recheck, make sure medication issues are resolved with insurance .  Visit summary with medication list and pertinent instructions was printed for patient to review, alert [place] if any changes needed. All questions at time of visit were answered - patient instructed to contact office with any additional concerns. ER/RTC precautions were reviewed with the patient and understanding verbalized.

## 2017-01-22 ENCOUNTER — Other Ambulatory Visit: Payer: Self-pay | Admitting: Osteopathic Medicine

## 2017-01-22 MED ORDER — INSULIN GLARGINE 300 UNIT/ML ~~LOC~~ SOPN: 60.0000 [IU] | PEN_INJECTOR | Freq: Every day | SUBCUTANEOUS | 3 refills | Status: DC

## 2017-01-22 NOTE — Progress Notes (Signed)
Toujeo Rx fixed and placed in fax box to send to pharmacy

## 2017-01-27 ENCOUNTER — Ambulatory Visit (INDEPENDENT_AMBULATORY_CARE_PROVIDER_SITE_OTHER): Payer: Medicare HMO | Admitting: Osteopathic Medicine

## 2017-01-27 ENCOUNTER — Encounter: Payer: Self-pay | Admitting: Osteopathic Medicine

## 2017-01-27 VITALS — BP 179/72 | HR 84 | Ht 71.0 in | Wt 328.0 lb

## 2017-01-27 DIAGNOSIS — E1165 Type 2 diabetes mellitus with hyperglycemia: Secondary | ICD-10-CM | POA: Diagnosis not present

## 2017-01-27 DIAGNOSIS — Z794 Long term (current) use of insulin: Secondary | ICD-10-CM | POA: Diagnosis not present

## 2017-01-27 DIAGNOSIS — E1169 Type 2 diabetes mellitus with other specified complication: Secondary | ICD-10-CM

## 2017-01-27 DIAGNOSIS — N183 Chronic kidney disease, stage 3 unspecified: Secondary | ICD-10-CM

## 2017-01-27 DIAGNOSIS — I10 Essential (primary) hypertension: Secondary | ICD-10-CM | POA: Diagnosis not present

## 2017-01-27 DIAGNOSIS — IMO0002 Reserved for concepts with insufficient information to code with codable children: Secondary | ICD-10-CM

## 2017-01-27 DIAGNOSIS — Z789 Other specified health status: Secondary | ICD-10-CM

## 2017-01-27 LAB — BASIC METABOLIC PANEL WITH GFR
BUN: 30 mg/dL — AB (ref 7–25)
CALCIUM: 9.8 mg/dL (ref 8.6–10.3)
CHLORIDE: 96 mmol/L — AB (ref 98–110)
CO2: 26 mmol/L (ref 20–31)
CREATININE: 1.57 mg/dL — AB (ref 0.70–1.25)
GFR, Est African American: 53 mL/min — ABNORMAL LOW (ref >=60)
GFR, Est Non African American: 46 mL/min — ABNORMAL LOW (ref >=60)
GLUCOSE: 375 mg/dL — AB (ref 65–99)
Potassium: 4.4 mmol/L (ref 3.5–5.3)
Sodium: 135 mmol/L (ref 135–146)

## 2017-01-27 NOTE — Progress Notes (Signed)
HPI: Matthew Barton is a 66 y.o. male  who presents to [facility] today, 01/27/17,  for chief complaint of:  Chief Complaint  Patient presents with  . Follow-up    BLOOD PRESSURE    HTN: Last visit we started Lasix and he needs BMP repeat and BP recheck. Hasn't taken BP meds as usual this morning. BP high. No chest pain/pressure  DM2: Finally got the Toujeo! Fasting Glc still high, in 200s/ Poor diet, no exercise.    Past medical history, surgical history, social history and family history reviewed.  Patient Active Problem List   Diagnosis Date Noted  . Beta-blocker intolerance 10/15/2016  . AKI (acute kidney injury) ([facility]) 10/15/2016  . Diabetic polyneuropathy associated with type 2 diabetes mellitus ([facility]) 07/29/2016  . Uncontrolled hypertension 07/22/2016  . Edema, lower extremity 07/22/2016  . Chronic bilateral low back pain without sciatica 07/22/2016  . Type II diabetes mellitus, uncontrolled ([facility]) 07/22/2016  . History of CVA (cerebrovascular accident) 07/22/2016    Current medication list and allergy/intolerance information reviewed.   Current Outpatient Prescriptions on File Prior to Visit  Medication Sig Dispense Refill  . amLODipine (NORVASC) 10 MG tablet TAKE ONE TABLET BY MOUTH ONCE DAILY 90 tablet 0  . Blood Glucose Calibration (ADVOCATE CONTROL SOLUTION) High LIQD Use with glucometer as directed 1 each 99  . blood glucose meter kit and supplies KIT Dispense based on patient and insurance preference. Use up to four times daily as directed. ICD 10 Dx: E11.59. Please dispense lancets x100 refill 99, test strips x100 refill 99, control solution x1 refill 99, and sharps container 1 each 0  . clopidogrel (PLAVIX) 75 MG tablet Take 1 tablet (75 mg total) by mouth daily. 90 tablet 3  . empagliflozin (JARDIANCE) 25 MG TABS tablet Take 25 mg by mouth daily. 30 tablet 1  . furosemide (LASIX) 20 MG tablet Take 1 tablet (20 mg total) by mouth 2  (two) times daily. 60 tablet 1  . glucose blood test strip Use up to 4 times per day as directed with glucometer. Disp: 100. Refill x99 100 each 99  . hydrochlorothiazide (HYDRODIURIL) 25 MG tablet Take 1 tablet (25 mg total) by mouth daily. 90 tablet 1  . Insulin Glargine (TOUJEO SOLOSTAR) 300 UNIT/ML SOPN Inject 60 Units into the skin daily. STOP Insulin 70/30. Start by injecting 60 units into the skin daily. Increase stepwise by 3 units at a time, twice per week, until fasting blood sugar is consistently 80-120, then stay at that dose. 5 pen 3  . Lancet Device MISC Use up to 4 times per day as directed with glucometer. Disp: 100. Refill x99 100 each 99  . losartan (COZAAR) 100 MG tablet Take 1 tablet (100 mg total) by mouth daily. 90 tablet 3  . lovastatin (MEVACOR) 10 MG tablet Take 1 tablet (10 mg total) by mouth at bedtime. 90 tablet 3   No current facility-administered medications on file prior to visit.    Allergies  Allergen Reactions  . Statins       Review of Systems:  Constitutional: No recent illness  HEENT: No  headache, no vision change  Cardiac: No  chest pain, No  pressure  Respiratory:  No  shortness of breath. No  Cough  Neurologic: No  weakness, No  Dizziness  Exam:  BP (!) 179/72   Pulse 84   Ht 5\' 11"  (1.803 m)   Wt (!) 328 lb (148.8 kg)   BMI  45.75 kg/m   Constitutional: VS see above. General Appearance: alert, well-developed, well-nourished, NAD  Neck: No masses, trachea midline.   Respiratory: Normal respiratory effort. no wheeze, no rhonchi, no rales  Cardiovascular: S1/S2 normal, no murmur, no rub/gallop auscultated. RRR.   Musculoskeletal: Gait normal. Symmetric and independent movement of all extremities    Recent Results (from the past 2160 hour(s))  POCT HgB A1C     Status: None   Collection Time: 01/20/17 10:22 AM  Result Value Ref Range   Hemoglobin A1C 11.1   COMPLETE METABOLIC PANEL WITH GFR     Status: Abnormal   Collection  Time: 01/20/17 10:52 AM  Result Value Ref Range   Sodium 140 135 - 146 mmol/L   Potassium 4.7 3.5 - 5.3 mmol/L   Chloride 101 98 - 110 mmol/L   CO2 27 20 - 31 mmol/L   Glucose, Bld 220 (H) 65 - 99 mg/dL   BUN 17 7 - 25 mg/dL   Creat 1.33 (H) 0.70 - 1.25 mg/dL    Comment:   For patients > or = 66 years of age: The upper reference limit for Creatinine is approximately 13% higher for people identified as African-American.      Total Bilirubin 0.4 0.2 - 1.2 mg/dL   Alkaline Phosphatase 61 40 - 115 U/L   AST 14 10 - 35 U/L   ALT 14 9 - 46 U/L   Total Protein 7.4 6.1 - 8.1 g/dL   Albumin 4.2 3.6 - 5.1 g/dL   Calcium 9.9 8.6 - 10.3 mg/dL   GFR, Est African American 64 >=60 mL/min   GFR, Est Non African American 56 (L) >=60 mL/min      ASSESSMENT/PLAN:   Uncontrolled hypertension - Plan: BASIC METABOLIC PANEL WITH GFR  Beta-blocker intolerance  CKD (chronic kidney disease), stage III - Plan: BASIC METABOLIC PANEL WITH GFR  Uncontrolled type 2 diabetes mellitus with other specified complication, with long-term current use of insulin ([place])    Patient Instructions  Plan:  1. Continue the Toujeo up to 100 units per day - you can start to increase the dose by 5 units at a time instead of 3 2. If fasting sugars are still high on the max dose of Toujeo, we will need to add mealtime insulin  3. Plan for nurse visit within the next week just for blood pressure check 4. Check labs today for medication safety on Lasix  5. See below re: weight loss       Follow-up plan: Return for nurse visit BP check this week, if BP ok, can see Dr. Sheppard Coil in 2-4 weeks to follow-up on sugars .  Visit summary with medication list and pertinent instructions was printed for patient to review, alert [place] if any changes needed. All questions at time of visit were answered - patient instructed to contact office with any additional concerns. ER/RTC precautions were reviewed with the patient and  understanding verbalized.

## 2017-01-27 NOTE — Patient Instructions (Addendum)
Plan:  1. Continue the Toujeo up to 100 units per day - you can start to increase the dose by 5 units at a time instead of 3 2. If fasting sugars are still high on the max dose of Toujeo, we will need to add mealtime insulin  3. Plan for nurse visit within the next week just for blood pressure check 4. Check labs today for medication safety on Lasix  5. See below re: weight loss     Weight loss: important things to remember  It is hard work! You will have setbacks, but don't get discouraged. The goal is not short-term success, it is long-term health.   Looking at the numbers is important to track your progress and set goals, but how you are feeling and your overall health are the most important things! BMI and pounds and calories and miles logged aren't everything - they are tools to help [place] reach your goals.  You can do this!!!   Things to remember for exercise for weight loss:   Please note - I am not a certified personal trainer. I can present you with ideas and general workout goals, but an exercise program is largely up to you. Find something you can stick with, and something you enjoy!   As you progress in your exercise regimen think about gradually increasing the following, week by week:   intensity (how strenuous is your workout)  frequency (how often you are exercising)  duration (how many minutes at a time you are exercising)  Walking for 20 minutes a day is certainly better than nothing, but more strenuous exercise will develop better cardiovascular fitness.   interval training (high-intensity alternating with low-intensity, think walk/jog rather than just walk)  muscle strengthening exercises (weight lifting, calisthenics, yoga) - this also helps prevent osteoporosis!   Things to remember for diet changes for weight loss:   Please note - I am not a certified dietician. I can present you with ideas and general diet goals, but a meal plan is largely up to you. I am  happy to refer you to a dietician who can give you a detailed meal plan.  Apps/logs are crucial to track how you're eating! It's not realistic to be logging everything you eat forever, but when you're starting a healthy eating lifestyle it's very helpful, and checking in with logs now and then helps you stick to your program!   Calorie restriction with the goal weight loss of no more than one to one and a half pounds per week.   Increase lean protein such as chicken, fish, [place].   Decrease fatty foods such as dairy, butter.   Decrease sugary foods. Avoid sugary drinks such as soda or juice.  Increase fiber found in fruit and vegetables.

## 2017-01-28 NOTE — Addendum Note (Signed)
Addended by: Deirdre Pippins on: 01/28/2017 05:06 PM   Modules accepted: Orders

## 2017-02-02 ENCOUNTER — Ambulatory Visit (INDEPENDENT_AMBULATORY_CARE_PROVIDER_SITE_OTHER): Payer: Medicare HMO | Admitting: Osteopathic Medicine

## 2017-02-02 VITALS — BP 182/72 | HR 75

## 2017-02-02 DIAGNOSIS — I10 Essential (primary) hypertension: Secondary | ICD-10-CM

## 2017-02-02 NOTE — Progress Notes (Signed)
Matthew Barton is here for a blood pressure check. Denies dizziness, headache, & blurred vision.  Pt reports that he did not take his medication this morning due to him not getting a good nights sleep and coming straight into his appointment. First reading was 182/72.  Pt sat for 5-10 minutes and second reading was 169/81.  Spoke with Dr. Lyn Hollingshead she advised that pt should come in on a day that he has taken his medications.  I stressed to the pt that he should come in on a day that he has taken his medications so that Dr. Lyn Hollingshead can see if this medication regiment is working. Pt verbalized understanding. -EH/RMA

## 2017-02-16 ENCOUNTER — Ambulatory Visit (INDEPENDENT_AMBULATORY_CARE_PROVIDER_SITE_OTHER): Payer: Medicare HMO | Admitting: Osteopathic Medicine

## 2017-02-16 VITALS — BP 137/64 | HR 72

## 2017-02-16 DIAGNOSIS — I10 Essential (primary) hypertension: Secondary | ICD-10-CM | POA: Diagnosis not present

## 2017-02-16 NOTE — Progress Notes (Signed)
BP 137/64   Pulse 72    Looks good. Please call / message patient and let him know continue current blood pressure medications. There is an order in the system to recheck kidney function - be sure to come back to the lab to get this done next week or so. Due to recheck diabetes 04/21/2017 but come see me sooner if any problems or questions about the medications.

## 2017-02-16 NOTE — Progress Notes (Signed)
Patient is here for blood pressurecheck. Denies any chest pain, palpitations, or any other medication problems. Pt has been taking his medications as prescribed, no complaints. Pt's BP at recheck was at goal. Will route to PCP for review. Pt would like any changes to be sent to him via MyChart.

## 2017-02-17 NOTE — Progress Notes (Signed)
Left recommendation on Pt's VM.  

## 2017-02-19 ENCOUNTER — Other Ambulatory Visit: Payer: Self-pay

## 2017-02-19 MED ORDER — FUROSEMIDE 20 MG PO TABS: 20.0000 mg | ORAL_TABLET | Freq: Two times a day (BID) | ORAL | 3 refills | Status: DC

## 2017-02-19 NOTE — Telephone Encounter (Signed)
Patient request refill for Furosemide 20 mg. #60 [facility]. Tal Kempker,CMA

## 2017-03-16 ENCOUNTER — Other Ambulatory Visit: Payer: Self-pay

## 2017-03-16 DIAGNOSIS — N183 Chronic kidney disease, stage 3 unspecified: Secondary | ICD-10-CM

## 2017-03-16 DIAGNOSIS — IMO0002 Reserved for concepts with insufficient information to code with codable children: Secondary | ICD-10-CM

## 2017-03-16 DIAGNOSIS — I10 Essential (primary) hypertension: Secondary | ICD-10-CM

## 2017-03-16 DIAGNOSIS — E1169 Type 2 diabetes mellitus with other specified complication: Secondary | ICD-10-CM

## 2017-03-16 DIAGNOSIS — E1165 Type 2 diabetes mellitus with hyperglycemia: Secondary | ICD-10-CM

## 2017-03-16 DIAGNOSIS — Z794 Long term (current) use of insulin: Secondary | ICD-10-CM

## 2017-03-20 LAB — BASIC METABOLIC PANEL WITH GFR
BUN: 14 mg/dL (ref 7–25)
CALCIUM: 9.3 mg/dL (ref 8.6–10.3)
CO2: 28 mmol/L (ref 20–31)
Chloride: 96 mmol/L — ABNORMAL LOW (ref 98–110)
Creat: 1.09 mg/dL (ref 0.70–1.25)
GFR, EST AFRICAN AMERICAN: 82 mL/min (ref >=60)
GFR, EST NON AFRICAN AMERICAN: 71 mL/min (ref >=60)
GLUCOSE: 271 mg/dL — AB (ref 65–99)
Potassium: 4.5 mmol/L (ref 3.5–5.3)
Sodium: 136 mmol/L (ref 135–146)

## 2017-03-23 ENCOUNTER — Other Ambulatory Visit: Payer: Self-pay

## 2017-03-23 DIAGNOSIS — E1165 Type 2 diabetes mellitus with hyperglycemia: Secondary | ICD-10-CM

## 2017-03-23 DIAGNOSIS — E1159 Type 2 diabetes mellitus with other circulatory complications: Secondary | ICD-10-CM

## 2017-03-23 DIAGNOSIS — IMO0002 Reserved for concepts with insufficient information to code with codable children: Secondary | ICD-10-CM

## 2017-03-23 DIAGNOSIS — Z794 Long term (current) use of insulin: Principal | ICD-10-CM

## 2017-03-23 MED ORDER — LOVASTATIN 10 MG PO TABS: 10.0000 mg | ORAL_TABLET | Freq: Every day | ORAL | 3 refills | Status: DC

## 2017-03-23 NOTE — Telephone Encounter (Signed)
PATIENT REQUEST A 90 DAY SUPPLY OF LOVASTATIN 10 MG. RX WAS SENT TO [facility]. Tashiba Timoney,CMA

## 2017-04-06 ENCOUNTER — Encounter: Payer: Self-pay | Admitting: Osteopathic Medicine

## 2017-04-06 ENCOUNTER — Ambulatory Visit (INDEPENDENT_AMBULATORY_CARE_PROVIDER_SITE_OTHER): Payer: Medicare HMO | Admitting: Osteopathic Medicine

## 2017-04-06 VITALS — BP 146/71 | HR 74 | Ht 71.0 in | Wt 316.0 lb

## 2017-04-06 DIAGNOSIS — E1165 Type 2 diabetes mellitus with hyperglycemia: Secondary | ICD-10-CM

## 2017-04-06 DIAGNOSIS — N183 Chronic kidney disease, stage 3 unspecified: Secondary | ICD-10-CM

## 2017-04-06 DIAGNOSIS — IMO0002 Reserved for concepts with insufficient information to code with codable children: Secondary | ICD-10-CM

## 2017-04-06 DIAGNOSIS — Z794 Long term (current) use of insulin: Secondary | ICD-10-CM | POA: Diagnosis not present

## 2017-04-06 DIAGNOSIS — G8929 Other chronic pain: Secondary | ICD-10-CM

## 2017-04-06 DIAGNOSIS — I10 Essential (primary) hypertension: Secondary | ICD-10-CM | POA: Diagnosis not present

## 2017-04-06 DIAGNOSIS — E1169 Type 2 diabetes mellitus with other specified complication: Secondary | ICD-10-CM | POA: Diagnosis not present

## 2017-04-06 DIAGNOSIS — Z6841 Body Mass Index (BMI) 40.0 and over, adult: Secondary | ICD-10-CM | POA: Insufficient documentation

## 2017-04-06 DIAGNOSIS — M545 Low back pain, unspecified: Secondary | ICD-10-CM

## 2017-04-06 MED ORDER — INSULIN GLARGINE 300 UNIT/ML ~~LOC~~ SOPN: 70.0000 [IU] | PEN_INJECTOR | Freq: Every day | SUBCUTANEOUS | 3 refills | Status: DC

## 2017-04-06 NOTE — Patient Instructions (Addendum)
Plan:  Consider MRI for back pain  For now, will refer to weight management - let us know if you don't get a call! Or you can call their office (Dr. Dalbert GarnetBeasley) [phone]

## 2017-04-06 NOTE — Progress Notes (Signed)
HPI: Matthew Barton is a 66 y.o. male  who presents to [place] today, 04/06/17,  for chief complaint of:  Chief Complaint  Patient presents with  . Back Pain    Long Hx back pain waxing and waning but always lower lumbar area. No previous MRI on file But he would like to avoid future MRI if at all possible given significant claustrophobia problems and reluctance anyway to proceed with injections/surgery. Hx CVA w/ LLE weakness but no new radicular symptoms. Back pain is a bit better today, he had some concerns about whether constipation might be exacerbating this.   Recent ER records reviewed, no concern for intestinal obstruction. Patient states normal bowel movements have resumed, he has been better about hydration and fiber intake.  Diabetes: Has been trying to reduce carbohydrates, not yet due for repeat A1c.  Obesity: Has been struggling to lose weight, would like to consider bariatric surgery or other medical management.  Past medical history, surgical history, social history and family history reviewed.  Patient Active Problem List   Diagnosis Date Noted  . Beta-blocker intolerance 10/15/2016  . AKI (acute kidney injury) ([place]) 10/15/2016  . Diabetic polyneuropathy associated with type 2 diabetes mellitus ([place]) 07/29/2016  . Uncontrolled hypertension 07/22/2016  . Edema, lower extremity 07/22/2016  . Chronic bilateral low back pain without sciatica 07/22/2016  . Type II diabetes mellitus, uncontrolled ([place]) 07/22/2016  . History of CVA (cerebrovascular accident) 07/22/2016    Current medication list and allergy/intolerance information reviewed.   Current Outpatient Prescriptions on File Prior to Visit  Medication Sig Dispense Refill  . amLODipine (NORVASC) 10 MG tablet TAKE ONE TABLET BY MOUTH ONCE DAILY 90 tablet 0  . Blood Glucose Calibration (ADVOCATE CONTROL SOLUTION) High LIQD Use with glucometer as directed 1 each 99  . blood  glucose meter kit and supplies KIT Dispense based on patient and insurance preference. Use up to four times daily as directed. ICD 10 Dx: E11.59. Please dispense lancets x100 refill 99, test strips x100 refill 99, control solution x1 refill 99, and sharps container 1 each 0  . clopidogrel (PLAVIX) 75 MG tablet Take 1 tablet (75 mg total) by mouth daily. 90 tablet 3  . empagliflozin (JARDIANCE) 25 MG TABS tablet Take 25 mg by mouth daily. 30 tablet 1  . furosemide (LASIX) 20 MG tablet Take 1 tablet (20 mg total) by mouth 2 (two) times daily. 60 tablet 3  . glucose blood test strip Use up to 4 times per day as directed with glucometer. Disp: 100. Refill x99 100 each 99  . hydrochlorothiazide (HYDRODIURIL) 25 MG tablet Take 1 tablet (25 mg total) by mouth daily. 90 tablet 1  . Insulin Glargine (TOUJEO SOLOSTAR) 300 UNIT/ML SOPN Inject 60 Units into the skin daily. STOP Insulin 70/30. Start by injecting 60 units into the skin daily. Increase stepwise by 3 units at a time, twice per week, until fasting blood sugar is consistently 80-120, then stay at that dose. 5 pen 3  . Lancet Device MISC Use up to 4 times per day as directed with glucometer. Disp: 100. Refill x99 100 each 99  . losartan (COZAAR) 100 MG tablet Take 1 tablet (100 mg total) by mouth daily. 90 tablet 3  . lovastatin (MEVACOR) 10 MG tablet Take 1 tablet (10 mg total) by mouth at bedtime. 90 tablet 3   No current facility-administered medications on file prior to visit.    Allergies  Allergen Reactions  . Statins  Review of Systems:  Constitutional: No recent illness  Cardiac: No  chest pain, No  pressure, No palpitations  Respiratory:  No  shortness of breath.   Gastrointestinal: No  abdominal pain  Musculoskeletal: No new myalgia/arthralgia  Neurologic: No  weakness, No  Dizziness   Exam:  BP (!) 145/68   Pulse 74   Ht '5\' 11"'$  (1.803 m)   Wt (!) 316 lb (143.3 kg)   BMI 44.07 kg/m   Constitutional: VS see  above. General Appearance: alert, well-developed, well-nourished, NAD  Eyes: Normal lids and conjunctive, non-icteric sclera  Ears, Nose, Mouth, Throat: MMM, Normal external inspection ears/nares/mouth/lips/gums.  Neck: No masses, trachea midline.   Respiratory: Normal respiratory effort. no wheeze, no rhonchi, no rales  Cardiovascular: S1/S2 normal, no murmur, no rub/gallop auscultated. RRR.   Musculoskeletal: Gait normal. Symmetric and independent movement of all extremities  Neurological: Normal balance/coordination. No tremor.  Skin: warm, dry, intact.   Psychiatric: Normal judgment/insight. Normal mood and affect. Oriented x3.     ASSESSMENT/PLAN:   Chronic bilateral low back pain without sciatica - Defer MRI for now, focus on home exercises, weight loss  Essential hypertension - Plan: Amb Ref to Medical Weight Management  CKD (chronic kidney disease), stage III - Plan: Amb Ref to Medical Weight Management  Uncontrolled type 2 diabetes mellitus with other specified complication, with long-term current use of insulin ([place]) - Plan to recheck A1c next visit - Plan: Amb Ref to Medical Weight Management  BMI 40.0-44.9, adult ([place]) - Given other comorbidities, I think he is a great candidate for follow-up with medical weight management. Referral placed - Plan: Amb Ref to Medical Weight Management    Patient Instructions  Plan:  Consider MRI for back pain  For now, will refer to weight management - let us know if you don't get a call! Or you can call their office (Dr. Leafy Ro) [phone]    Follow-up plan: Return in about 2 weeks (around 04/22/2017) for recheck A1C.  Visit summary with medication list and pertinent instructions was printed for patient to review, alert [place] if any changes needed. All questions at time of visit were answered - patient instructed to contact office with any additional concerns. ER/RTC precautions were reviewed with the patient and understanding  verbalized.

## 2017-04-07 ENCOUNTER — Other Ambulatory Visit: Payer: Self-pay

## 2017-04-07 DIAGNOSIS — E1159 Type 2 diabetes mellitus with other circulatory complications: Secondary | ICD-10-CM

## 2017-04-07 DIAGNOSIS — E1165 Type 2 diabetes mellitus with hyperglycemia: Secondary | ICD-10-CM

## 2017-04-07 DIAGNOSIS — IMO0002 Reserved for concepts with insufficient information to code with codable children: Secondary | ICD-10-CM

## 2017-04-07 MED ORDER — BLOOD GLUCOSE MONITOR KIT: PACK | 0 refills | Status: AC

## 2017-04-09 ENCOUNTER — Telehealth: Payer: Self-pay

## 2017-04-09 MED ORDER — NAPROXEN 500 MG PO TABS: 500.0000 mg | ORAL_TABLET | Freq: Two times a day (BID) | ORAL | 3 refills | Status: AC

## 2017-04-09 NOTE — Telephone Encounter (Signed)
Pt called and wants to know what he can take for his back pain? Please advise?

## 2017-04-09 NOTE — Telephone Encounter (Signed)
Pt informed. Pt expressed understanding and is agreeable. Colden Samaras CMA, RT 

## 2017-04-09 NOTE — Telephone Encounter (Signed)
I sent in a prescription anti-inflammatory for him to take as needed, can take this along with Tylenol if desired, maximum dose of Tylenol 1000 mg up to 4 times per day

## 2017-04-15 ENCOUNTER — Telehealth: Payer: Self-pay

## 2017-04-15 DIAGNOSIS — M545 Low back pain, unspecified: Secondary | ICD-10-CM

## 2017-04-15 DIAGNOSIS — G8929 Other chronic pain: Secondary | ICD-10-CM

## 2017-04-15 MED ORDER — CYCLOBENZAPRINE HCL 10 MG PO TABS: 5.0000 mg | ORAL_TABLET | Freq: Three times a day (TID) | ORAL | 1 refills | Status: DC | PRN

## 2017-04-15 NOTE — Telephone Encounter (Signed)
Pt called and wants to know what he can take for back pain. Pt stated that he is in a lot of pain. Pt also stated that he wants to schedule an MRI. Please advise?

## 2017-04-15 NOTE — Telephone Encounter (Signed)
Ok, sorry I didn't have the Tylenol on his med list - let's make sure that's added.  I sent muscle relaxer to pharmacy

## 2017-04-15 NOTE — Telephone Encounter (Signed)
Orders placed or MRI, once the patient has this scheduled, he can call me to get prescription for anti-anxiety meds so he can go through the procedure. At this point, he can add Tylenol 1000 mg 4 times per day to his current dose of naproxen, I can send in some muscle relaxers but any opioid pain medications will require an office visit if the above is not helpful.

## 2017-04-15 NOTE — Telephone Encounter (Signed)
Pt stated that he was already instructed to take tylenol 1000 mg 4 times per day and it still is not helping. Please advise?

## 2017-04-16 ENCOUNTER — Telehealth: Payer: Self-pay | Admitting: *Deleted

## 2017-04-16 ENCOUNTER — Other Ambulatory Visit: Payer: Self-pay

## 2017-04-16 DIAGNOSIS — E1165 Type 2 diabetes mellitus with hyperglycemia: Secondary | ICD-10-CM

## 2017-04-16 DIAGNOSIS — IMO0002 Reserved for concepts with insufficient information to code with codable children: Secondary | ICD-10-CM

## 2017-04-16 DIAGNOSIS — E1159 Type 2 diabetes mellitus with other circulatory complications: Secondary | ICD-10-CM

## 2017-04-16 NOTE — Telephone Encounter (Signed)
LVM requesting pt to call the office. Medication list is updated to include Tylenol.

## 2017-04-16 NOTE — Telephone Encounter (Signed)
Helen from [facility] called and states patient was scheduled for MRI today but his wife called and states he is claustrophobic and will need to be sedated. Matthew JacobsonHelen states she did encourage his wife to have the patient try and see if he could attempt the MRI without but the wife insisted he has tried and failed this with other attempts. A new order will have to be placed for an MRI with sedation

## 2017-04-16 NOTE — Telephone Encounter (Signed)
Called the patient to clarify

## 2017-04-16 NOTE — Telephone Encounter (Signed)
OK - I knew he was claustrophobic, I can order him some anti-anxiety medication to take an hour or so prior to the MRI, but if he is requesting additional sedation I am not sure how to schedule that.

## 2017-04-19 ENCOUNTER — Other Ambulatory Visit: Payer: Self-pay

## 2017-04-19 DIAGNOSIS — E1165 Type 2 diabetes mellitus with hyperglycemia: Secondary | ICD-10-CM

## 2017-04-19 DIAGNOSIS — E1159 Type 2 diabetes mellitus with other circulatory complications: Secondary | ICD-10-CM

## 2017-04-19 DIAGNOSIS — IMO0002 Reserved for concepts with insufficient information to code with codable children: Secondary | ICD-10-CM

## 2017-04-19 MED ORDER — ACCU-CHEK FASTCLIX LANCET KIT: PACK | 4 refills | Status: AC

## 2017-04-19 MED ORDER — INSULIN PEN NEEDLE 32G X 4 MM MISC: 4 refills | Status: AC

## 2017-04-19 MED ORDER — ADVOCATE CONTROL SOLUTION HIGH VI LIQD: 99 refills | Status: AC

## 2017-04-22 ENCOUNTER — Ambulatory Visit (INDEPENDENT_AMBULATORY_CARE_PROVIDER_SITE_OTHER): Payer: Medicare HMO | Admitting: Osteopathic Medicine

## 2017-04-22 ENCOUNTER — Encounter: Payer: Self-pay | Admitting: Osteopathic Medicine

## 2017-04-22 VITALS — BP 136/72 | HR 70 | Wt 323.0 lb

## 2017-04-22 DIAGNOSIS — E1165 Type 2 diabetes mellitus with hyperglycemia: Secondary | ICD-10-CM

## 2017-04-22 DIAGNOSIS — E1159 Type 2 diabetes mellitus with other circulatory complications: Secondary | ICD-10-CM | POA: Diagnosis not present

## 2017-04-22 DIAGNOSIS — G8929 Other chronic pain: Secondary | ICD-10-CM | POA: Diagnosis not present

## 2017-04-22 DIAGNOSIS — M545 Low back pain, unspecified: Secondary | ICD-10-CM

## 2017-04-22 DIAGNOSIS — I10 Essential (primary) hypertension: Secondary | ICD-10-CM | POA: Diagnosis not present

## 2017-04-22 DIAGNOSIS — Z794 Long term (current) use of insulin: Secondary | ICD-10-CM | POA: Diagnosis not present

## 2017-04-22 DIAGNOSIS — N183 Chronic kidney disease, stage 3 unspecified: Secondary | ICD-10-CM

## 2017-04-22 DIAGNOSIS — IMO0002 Reserved for concepts with insufficient information to code with codable children: Secondary | ICD-10-CM

## 2017-04-22 LAB — POCT GLYCOSYLATED HEMOGLOBIN (HGB A1C): Hemoglobin A1C: 10.2

## 2017-04-22 MED ORDER — INSULIN ASPART 100 UNIT/ML FLEXPEN: 10.0000 [IU] | PEN_INJECTOR | Freq: Every day | SUBCUTANEOUS | 11 refills | Status: DC

## 2017-04-22 MED ORDER — LORAZEPAM 1 MG PO TABS: 1.0000 mg | ORAL_TABLET | Freq: Once | ORAL | 0 refills | Status: AC

## 2017-04-22 MED ORDER — TRAMADOL HCL 50 MG PO TABS: 50.0000 mg | ORAL_TABLET | Freq: Three times a day (TID) | ORAL | 0 refills | Status: DC | PRN

## 2017-04-22 NOTE — Progress Notes (Signed)
HPI: Matthew Barton is a 66 y.o. male  who presents to [place] today, 04/22/17,  for chief complaint of:  Chief Complaint  Patient presents with  . Diabetes    DM2: At last visit 01/2017 A1C 11.1 Insulin: Prescribed Lantus 60 units qhs + Novolog 10 units pre-lunch/dinner, has not been taking these [place] may be covered, needs Rx  Still taking 70/30 at 46 units twice per day  Metformin intolerant Plan at that time:   Continue Empagliflozin as you are taking it  Measure fasting blood sugar every day: goal for now is to get this to 80-130  Start at 60 Units and increase daily Toujeo dose by 3 units at a time, twice per week, until fasting sugars are consistently 80-130, then continue at that dose Today 04/22/17 A1C 10.2 Taking Toujeo at 60 units, hasn't titrated up because Sometimes fasting blood sugar in the 70s, sometimes in 200s. Notices that it is higher when she has had larger meal during the day or treating on diet.  Hypertension: No chest pain, pressure, shortness of breath. No home blood pressures to report.  Back pain: About the same, doing a little bit better today. Went down to imaging and they showed him to CT scanner, he laid down and went in and out of this, thinks he might be able to tolerate MRI with knowledge that it will be longer/louder. He believes he can get away with just oral sedation     Past medical history, surgical history, social history and family history reviewed.  Patient Active Problem List   Diagnosis Date Noted  . BMI 40.0-44.9, adult ([place]) 04/06/2017  . Beta-blocker intolerance 10/15/2016  . AKI (acute kidney injury) ([place]) 10/15/2016  . Diabetic polyneuropathy associated with type 2 diabetes mellitus ([place]) 07/29/2016  . Uncontrolled hypertension 07/22/2016  . Edema, lower extremity 07/22/2016  . Chronic bilateral low back pain without sciatica 07/22/2016  . Type II diabetes mellitus, uncontrolled  ([place]) 07/22/2016  . History of CVA (cerebrovascular accident) 07/22/2016    Current medication list and allergy/intolerance information reviewed.   Current Outpatient Prescriptions on File Prior to Visit  Medication Sig Dispense Refill  . acetaminophen (TYLENOL) 500 MG tablet Take 1,000 mg by mouth every 6 (six) hours as needed.    Marland Kitchen amLODipine (NORVASC) 10 MG tablet TAKE ONE TABLET BY MOUTH ONCE DAILY 90 tablet 0  . Blood Glucose Calibration (ADVOCATE CONTROL SOLUTION) High LIQD Use with glucometer to check blood sugar up to four times daily as directed. ICD 10 Dx: E11.59. 1 each 99  . blood glucose meter kit and supplies KIT Dispense based on patient and insurance preference. Use up to four times daily as directed. ICD 10 Dx: E11.59. Please dispense lancets x100 refill 99, test strips x100 refill 99, control solution x1 refill 99, and sharps container 1 each 0  . clopidogrel (PLAVIX) 75 MG tablet Take 1 tablet (75 mg total) by mouth daily. 90 tablet 3  . cyclobenzaprine (FLEXERIL) 10 MG tablet Take 0.5-1 tablets (5-10 mg total) by mouth 3 (three) times daily as needed. One half tab PO qHS, then increase gradually to one tab TID. 90 tablet 1  . empagliflozin (JARDIANCE) 25 MG TABS tablet Take 25 mg by mouth daily. 30 tablet 1  . furosemide (LASIX) 20 MG tablet Take 1 tablet (20 mg total) by mouth 2 (two) times daily. 60 tablet 3  . glucose blood test strip Use up to 4 times per day  as directed with glucometer. Disp: 100. Refill x99 100 each 99  . hydrochlorothiazide (HYDRODIURIL) 25 MG tablet Take 1 tablet (25 mg total) by mouth daily. 90 tablet 1  . Insulin Glargine (TOUJEO SOLOSTAR) 300 UNIT/ML SOPN Inject 70 Units into the skin daily. Increase stepwise by 5 units at a time, twice per week, until fasting blood sugar is consistently 80-120, then stay at that dose. 5 pen 3  . Insulin Pen Needle (BD PEN NEEDLE NANO U/F) 32G X 4 MM MISC Use to check blood sugar up to four times daily as directed.  ICD 10 Dx: E11.59. 100 each 4  . Lancet Device MISC Use up to 4 times per day as directed with glucometer. Disp: 100. Refill x99 100 each 99  . Lancets Misc. (ACCU-CHEK FASTCLIX LANCET) KIT Use to check blood sugar up to four times daily as directed. ICD 10 Dx: E11.59. 1 kit 4  . losartan (COZAAR) 100 MG tablet Take 1 tablet (100 mg total) by mouth daily. 90 tablet 3  . lovastatin (MEVACOR) 10 MG tablet Take 1 tablet (10 mg total) by mouth at bedtime. 90 tablet 3  . naproxen (NAPROSYN) 500 MG tablet Take 1 tablet (500 mg total) by mouth 2 (two) times daily with a meal. As needed for pain 60 tablet 3   No current facility-administered medications on file prior to visit.    Allergies  Allergen Reactions  . Statins       Review of Systems:  Constitutional: No recent illness  HEENT: No  headache, no vision change  Cardiac: No  chest pain, No  pressure, No palpitations  Respiratory:  No  shortness of breath. No  Cough  Gastrointestinal: No  abdominal pain, no change on bowel habits  Musculoskeletal: No new myalgia/arthralgia  Neurologic: No  weakness, No  Dizziness   Exam:  BP 136/72   Pulse 70   Wt (!) 323 lb (146.5 kg)   BMI 45.05 kg/m   Constitutional: VS see above. General Appearance: alert, well-developed, well-nourished, NAD  Eyes: Normal lids and conjunctive, non-icteric sclera  Ears, Nose, Mouth, Throat: MMM, Normal external inspection ears/nares/mouth/lips/gums.  Neck: No masses, trachea midline.   Respiratory: Normal respiratory effort. no wheeze, no rhonchi, no rales  Cardiovascular: S1/S2 normal, no murmur, no rub/gallop auscultated. RRR.   Musculoskeletal: Gait normal. Symmetric and independent movement of all extremities  Neurological: Normal balance/coordination. No tremor.  Skin: warm, dry, intact.   Psychiatric: Normal judgment/insight. Normal mood and affect. Oriented x3.    Results for orders placed or performed in visit on 04/22/17 (from the  past 24 hour(s))  POCT HgB A1C     Status: None   Collection Time: 04/22/17  8:36 AM  Result Value Ref Range   Hemoglobin A1C 10.2       ASSESSMENT/PLAN:   Uncontrolled type 2 diabetes mellitus with other circulatory complication, with long-term current use of insulin ([facility]) - Add short-acting mealtime insulin, discussed dietary modifications, exercise is difficult due to low back pain. - Plan: POCT HgB A1C  Essential hypertension - Blood pressure improved on recheck, continue current medications  Chronic bilateral low back pain without sciatica - Plan for MRI, by mouth sedation provided, tramadol short-term prescription provided as needed, controlled substance database reviewed and no concerns  CKD (chronic kidney disease), stage III    Patient Instructions  Plan:  MRI of back - come see me 2-3 days after for discusison of results  Add short-acting insulin to largest meal of  the day (Novolog 10 Units), otherwise continue what you're doing with the Toujeo and other medications  Any problmes, come see me!     Follow-up plan: Return for MRI results after your procedure .  Visit summary with medication list and pertinent instructions was printed for patient to review, alert [place] if any changes needed. All questions at time of visit were answered - patient instructed to contact office with any additional concerns. ER/RTC precautions were reviewed with the patient and understanding verbalized.

## 2017-04-22 NOTE — Patient Instructions (Addendum)
Plan:  MRI of back - come see me 2-3 days after for discusison of results  Add short-acting insulin to largest meal of the day (Novolog 10 Units), otherwise continue what you're doing with the Toujeo and other medications  Any problmes, come see me!

## 2017-04-26 ENCOUNTER — Ambulatory Visit (INDEPENDENT_AMBULATORY_CARE_PROVIDER_SITE_OTHER): Payer: Medicare HMO

## 2017-04-26 DIAGNOSIS — M48061 Spinal stenosis, lumbar region without neurogenic claudication: Secondary | ICD-10-CM | POA: Diagnosis not present

## 2017-04-26 DIAGNOSIS — M47896 Other spondylosis, lumbar region: Secondary | ICD-10-CM | POA: Diagnosis not present

## 2017-04-29 ENCOUNTER — Other Ambulatory Visit: Payer: Self-pay | Admitting: *Deleted

## 2017-04-29 ENCOUNTER — Encounter: Payer: Self-pay | Admitting: Osteopathic Medicine

## 2017-04-29 ENCOUNTER — Ambulatory Visit (INDEPENDENT_AMBULATORY_CARE_PROVIDER_SITE_OTHER): Payer: Medicare HMO | Admitting: Osteopathic Medicine

## 2017-04-29 VITALS — BP 140/82 | HR 84 | Wt 326.0 lb

## 2017-04-29 DIAGNOSIS — M545 Low back pain, unspecified: Secondary | ICD-10-CM

## 2017-04-29 DIAGNOSIS — I10 Essential (primary) hypertension: Secondary | ICD-10-CM | POA: Diagnosis not present

## 2017-04-29 DIAGNOSIS — E1159 Type 2 diabetes mellitus with other circulatory complications: Secondary | ICD-10-CM | POA: Diagnosis not present

## 2017-04-29 DIAGNOSIS — E1165 Type 2 diabetes mellitus with hyperglycemia: Secondary | ICD-10-CM

## 2017-04-29 DIAGNOSIS — IMO0002 Reserved for concepts with insufficient information to code with codable children: Secondary | ICD-10-CM

## 2017-04-29 DIAGNOSIS — Z6841 Body Mass Index (BMI) 40.0 and over, adult: Secondary | ICD-10-CM | POA: Diagnosis not present

## 2017-04-29 DIAGNOSIS — Z794 Long term (current) use of insulin: Secondary | ICD-10-CM

## 2017-04-29 DIAGNOSIS — G8929 Other chronic pain: Secondary | ICD-10-CM

## 2017-04-29 MED ORDER — LANCET DEVICE MISC: 6 refills | Status: AC

## 2017-04-29 MED ORDER — INSULIN LISPRO 100 UNIT/ML (KWIKPEN): 10.0000 [IU] | PEN_INJECTOR | Freq: Every day | SUBCUTANEOUS | 11 refills | Status: DC

## 2017-04-29 MED ORDER — ALCOHOL PREP PADS: MEDICATED_PAD | 6 refills | Status: AC

## 2017-04-29 NOTE — Patient Instructions (Signed)
Research: Spine surgery  Epidural injections Let me know if you'd like referral to neurosurgeon

## 2017-04-29 NOTE — Progress Notes (Signed)
HPI: Matthew Barton is a 66 y.o. male  who presents to [facility] today, 04/29/17,  for chief complaint of:  Chief Complaint  Patient presents with  . Results    Persistent lower back pain, recently underwent MRI for further evaluation. Results discussed with patient and images reviewed with patient. There is some significant disc space disease at L3-L4 and L4-L5. Multilevel spondylosis. Stenosis at L4-L5 most severe. Left-sided pathology at L3-L4, right-sided pathology at L4-L5  He is not eager to pursue interventions such as injection or surgery/neuro surgery consultation without doing some research on his own first.    Diabetes: Fasting blood sugars have been close to goal but occasionally dropping a little bit low in to 70 or so. He is taking the Tuesday oh in the mornings. The NovoLog for once daily mealtime use was not approved.  Hypertension: Blood pressure is improved on recheck, no chest pain, pressure, shortness of breath.   Past medical history, surgical history, social history and family history reviewed.  Patient Active Problem List   Diagnosis Date Noted  . BMI 40.0-44.9, adult ([place]) 04/06/2017  . Beta-blocker intolerance 10/15/2016  . AKI (acute kidney injury) ([place]) 10/15/2016  . Diabetic polyneuropathy associated with type 2 diabetes mellitus ([place]) 07/29/2016  . Uncontrolled hypertension 07/22/2016  . Edema, lower extremity 07/22/2016  . Chronic bilateral low back pain without sciatica 07/22/2016  . Type II diabetes mellitus, uncontrolled ([place]) 07/22/2016  . History of CVA (cerebrovascular accident) 07/22/2016    Current medication list and allergy/intolerance information reviewed.   Current Outpatient Prescriptions on File Prior to Visit  Medication Sig Dispense Refill  . acetaminophen (TYLENOL) 500 MG tablet Take 1,000 mg by mouth every 6 (six) hours as needed.    Marland Kitchen amLODipine (NORVASC) 10 MG tablet TAKE ONE TABLET BY MOUTH  ONCE DAILY 90 tablet 0  . Blood Glucose Calibration (ADVOCATE CONTROL SOLUTION) High LIQD Use with glucometer to check blood sugar up to four times daily as directed. ICD 10 Dx: E11.59. 1 each 99  . blood glucose meter kit and supplies KIT Dispense based on patient and insurance preference. Use up to four times daily as directed. ICD 10 Dx: E11.59. Please dispense lancets x100 refill 99, test strips x100 refill 99, control solution x1 refill 99, and sharps container 1 each 0  . clopidogrel (PLAVIX) 75 MG tablet Take 1 tablet (75 mg total) by mouth daily. 90 tablet 3  . cyclobenzaprine (FLEXERIL) 10 MG tablet Take 0.5-1 tablets (5-10 mg total) by mouth 3 (three) times daily as needed. One half tab PO qHS, then increase gradually to one tab TID. 90 tablet 1  . empagliflozin (JARDIANCE) 25 MG TABS tablet Take 25 mg by mouth daily. 30 tablet 1  . furosemide (LASIX) 20 MG tablet Take 1 tablet (20 mg total) by mouth 2 (two) times daily. 60 tablet 3  . glucose blood test strip Use up to 4 times per day as directed with glucometer. Disp: 100. Refill x99 100 each 99  . hydrochlorothiazide (HYDRODIURIL) 25 MG tablet Take 1 tablet (25 mg total) by mouth daily. 90 tablet 1  . insulin aspart (NOVOLOG FLEXPEN) 100 UNIT/ML FlexPen Inject 10 Units into the skin daily. Take prior to largest meal of the day 15 mL 11  . Insulin Glargine (TOUJEO SOLOSTAR) 300 UNIT/ML SOPN Inject 70 Units into the skin daily. Increase stepwise by 5 units at a time, twice per week, until fasting blood sugar is consistently 80-120, then  stay at that dose. 5 pen 3  . Insulin Pen Needle (BD PEN NEEDLE NANO U/F) 32G X 4 MM MISC Use to check blood sugar up to four times daily as directed. ICD 10 Dx: E11.59. 100 each 4  . Lancet Device MISC Use up to 4 times per day as directed with glucometer. Disp: 100. Refill x99 100 each 99  . Lancets Misc. (ACCU-CHEK FASTCLIX LANCET) KIT Use to check blood sugar up to four times daily as directed. ICD 10 Dx:  E11.59. 1 kit 4  . losartan (COZAAR) 100 MG tablet Take 1 tablet (100 mg total) by mouth daily. 90 tablet 3  . lovastatin (MEVACOR) 10 MG tablet Take 1 tablet (10 mg total) by mouth at bedtime. 90 tablet 3  . naproxen (NAPROSYN) 500 MG tablet Take 1 tablet (500 mg total) by mouth 2 (two) times daily with a meal. As needed for pain 60 tablet 3  . traMADol (ULTRAM) 50 MG tablet Take 1 tablet (50 mg total) by mouth every 8 (eight) hours as needed for severe pain. 15 tablet 0   No current facility-administered medications on file prior to visit.    Allergies  Allergen Reactions  . Statins       Review of Systems:  Constitutional: No recent illness  HEENT: No  headache, no vision change  Cardiac: No  chest pain, No  pressure, No palpitations  Respiratory:  No  shortness of breath. No  Cough  Gastrointestinal: No  abdominal pain  Musculoskeletal: No new myalgia/arthralgia  Neurologic: No  weakness, No  Dizziness   Exam:  BP 140/82   Pulse 84   Wt (!) 326 lb (147.9 kg)   SpO2 100%   BMI 45.47 kg/m   Constitutional: VS see above. General Appearance: alert, well-developed, well-nourished, NAD  Eyes: Normal lids and conjunctive, non-icteric sclera  Ears, Nose, Mouth, Throat: MMM, Normal external inspection ears/nares/mouth/lips/gums.  Neck: No masses, trachea midline.   Respiratory: Normal respiratory effort. no wheeze, no rhonchi, no rales  Cardiovascular: S1/S2 normal, no murmur, no rub/gallop auscultated. RRR.   Musculoskeletal: Gait normal. Symmetric and independent movement of all extremities  Neurological: Normal balance/coordination. No tremor.  Skin: warm, dry, intact.   Psychiatric: Normal judgment/insight. Normal mood and affect. Oriented x3.    Recent Results (from the past 2160 hour(s))  BASIC METABOLIC PANEL WITH GFR     Status: Abnormal   Collection Time: 03/19/17 12:01 PM  Result Value Ref Range   Sodium 136 135 - 146 mmol/L   Potassium 4.5 3.5 -  5.3 mmol/L   Chloride 96 (L) 98 - 110 mmol/L   CO2 28 20 - 31 mmol/L   Glucose, Bld 271 (H) 65 - 99 mg/dL   BUN 14 7 - 25 mg/dL   Creat 1.09 0.70 - 1.25 mg/dL    Comment:   For patients > or = 66 years of age: The upper reference limit for Creatinine is approximately 13% higher for people identified as African-American.      Calcium 9.3 8.6 - 10.3 mg/dL   GFR, Est African American 82 >=60 mL/min   GFR, Est Non African American 71 >=60 mL/min  POCT HgB A1C     Status: None   Collection Time: 04/22/17  8:36 AM  Result Value Ref Range   Hemoglobin A1C 10.2     Mr Lumbar Spine Wo Contrast  Result Date: 04/26/2017 CLINICAL DATA:  Low back pain.  Injury 3 weeks ago. EXAM: MRI LUMBAR SPINE  WITHOUT CONTRAST TECHNIQUE: Multiplanar, multisequence MR imaging of the lumbar spine was performed. No intravenous contrast was administered. COMPARISON:  None. FINDINGS: Segmentation:  Standard. Alignment:  Anatomic. Vertebrae:  No worrisome osseous lesion. Conus medullaris: Extends to the L1 level and appears normal. Paraspinal and other soft tissues: Increased body habitus without signs of epidural lipomatosis. Disc levels: L1-L2: No disc protrusion. Mild annular bulging. Facet arthropathy. No impingement. L2-L3: Disc space narrowing. Annular bulging. Short pedicles. Posterior element hypertrophy. Mild stenosis without impingement. L3-L4: Disc space narrowing. Annular bulging. Short pedicles. Posterior element hypertrophy. Far-lateral and foraminal protrusion on the LEFT. Mild moderate stenosis. LEFT L4 and L3 nerve root impingement likely. L4-L5: Slight disc desiccation. Annular bulge centrally. Short pedicles. Posterior element hypertrophy. Severe stenosis. BILATERAL subarticular zone narrowing affecting the L5 nerve roots. Far-lateral and foraminal protrusion on RIGHT. RIGHT L4 nerve root impingement. L5-S1: Good disc height and hydration. Annular bulge. Posterior element hypertrophy. Short pedicles. No  definite impingement. IMPRESSION: Multilevel spondylosis as described. Congenital and acquired stenosis at multiple levels, most severe at L4-5. Predominantly LEFT-sided pathology at L3-4, and RIGHT-sided pathology at L4-5, although no radicular symptoms are specifically reported. Correlate clinically. Electronically Signed   By: Staci Righter M.D.   On: 04/26/2017 12:28    No flowsheet data found.  Depression screen [facility] 2/9 01/20/2017 08/05/2016  Decreased Interest 1 0  Down, Depressed, Hopeless 0 0  PHQ - 2 Score 1 0      ASSESSMENT/PLAN:   Chronic bilateral low back pain without sciatica - Patient will research options and get back to me if he desires referral for injection or neurosurgical consult  Essential hypertension - Above goal but intolerant to multiple medications, patient states would rather have BP as is and lower with side effects. Has capacity to make this choice  Uncontrolled type 2 diabetes mellitus with other circulatory complication, with long-term current use of insulin (HCC) - Alternative insulin sent, hopefully this will be covered and affordable. Directions same, 10 units prior to largest meal of day  BMI 40.0-44.9, adult (HCC) - Weight loss discussed as treatment for diabetes, hypertension, back pain    Patient Instructions  Research: Spine surgery  Epidural injections Let me know if you'd like referral to neurosurgeon      Follow-up plan: Return in about 3 months (around 07/30/2017) for recheck diabetes, sooner if needed.  Visit summary with medication list and pertinent instructions was printed for patient to review, alert [place] if any changes needed. All questions at time of visit were answered - patient instructed to contact office with any additional concerns. ER/RTC precautions were reviewed with the patient and understanding verbalized.

## 2017-04-30 ENCOUNTER — Other Ambulatory Visit: Payer: Self-pay | Admitting: Osteopathic Medicine

## 2017-04-30 DIAGNOSIS — Z794 Long term (current) use of insulin: Secondary | ICD-10-CM

## 2017-04-30 DIAGNOSIS — E118 Type 2 diabetes mellitus with unspecified complications: Secondary | ICD-10-CM

## 2017-04-30 DIAGNOSIS — I10 Essential (primary) hypertension: Secondary | ICD-10-CM

## 2017-05-05 ENCOUNTER — Telehealth: Payer: Self-pay | Admitting: *Deleted

## 2017-05-05 NOTE — Telephone Encounter (Signed)
Pre Authorization sent to cover my meds. [id number]

## 2017-05-06 NOTE — Telephone Encounter (Signed)
humalog approved.pharm notified

## 2017-05-18 ENCOUNTER — Encounter: Payer: Self-pay | Admitting: Osteopathic Medicine

## 2017-07-30 ENCOUNTER — Ambulatory Visit (INDEPENDENT_AMBULATORY_CARE_PROVIDER_SITE_OTHER): Payer: Medicare HMO | Admitting: Osteopathic Medicine

## 2017-07-30 VITALS — BP 142/74 | HR 80 | Temp 97.9°F | Ht 71.0 in | Wt 332.6 lb

## 2017-07-30 DIAGNOSIS — Z23 Encounter for immunization: Secondary | ICD-10-CM

## 2017-07-30 DIAGNOSIS — E1165 Type 2 diabetes mellitus with hyperglycemia: Secondary | ICD-10-CM

## 2017-07-30 DIAGNOSIS — Z6841 Body Mass Index (BMI) 40.0 and over, adult: Secondary | ICD-10-CM | POA: Diagnosis not present

## 2017-07-30 DIAGNOSIS — Z794 Long term (current) use of insulin: Secondary | ICD-10-CM | POA: Diagnosis not present

## 2017-07-30 DIAGNOSIS — I10 Essential (primary) hypertension: Secondary | ICD-10-CM

## 2017-07-30 LAB — POCT UA - MICROALBUMIN
CREATININE, POC: 50 mg/dL
MICROALBUMIN (UR) POC: 150 mg/L

## 2017-07-30 LAB — POCT GLYCOSYLATED HEMOGLOBIN (HGB A1C): HEMOGLOBIN A1C: 12.5

## 2017-07-30 MED ORDER — INSULIN ASPART PROT & ASPART (70-30 MIX) 100 UNIT/ML ~~LOC~~ SUSP: SUBCUTANEOUS | 11 refills | Status: DC

## 2017-07-30 NOTE — Progress Notes (Signed)
HPI: Matthew Barton is a 66 y.o. male  who presents to [facility] today, 07/30/17,  for chief complaint of:  Chief Complaint  Patient presents with  . Diabetes    Diabetes:  He is not taking the basal/bolus regimen, we have had considerable difficulty getting him medications due to cost issues even though he has insurance. He is back on the 70/30 insulin as this is all he can afford. Blood sugars are poorly controlled, though he does not have any symptoms of polyuria/polydipsia.  Back on 70/30 - 44 units bid  No hypoglycemia Glc fasting around 200 or above    Hypertension: No chest pain, pressure, shortness of breath. BP looks pretty good today actually.     Past medical, surgical, social and family history reviewed: Patient Active Problem List   Diagnosis Date Noted  . BMI 40.0-44.9, adult ([place]) 04/06/2017  . Beta-blocker intolerance 10/15/2016  . AKI (acute kidney injury) ([place]) 10/15/2016  . Diabetic polyneuropathy associated with type 2 diabetes mellitus ([place]) 07/29/2016  . Uncontrolled hypertension 07/22/2016  . Edema, lower extremity 07/22/2016  . Chronic bilateral low back pain without sciatica 07/22/2016  . Type II diabetes mellitus, uncontrolled ([place]) 07/22/2016  . History of CVA (cerebrovascular accident) 07/22/2016   No past surgical history on file. Social History  Substance Use Topics  . Smoking status: Never Smoker  . Smokeless tobacco: Never Used  . Alcohol use No   No family history on file.   Current medication list and allergy/intolerance information reviewed:   Current Outpatient Prescriptions  Medication Sig Dispense Refill  . acetaminophen (TYLENOL) 500 MG tablet Take 1,000 mg by mouth every 6 (six) hours as needed.    . Alcohol Swabs (ALCOHOL PREP) PADS Use up to four times a day DX:E11.9 400 each 6  . amLODipine (NORVASC) 10 MG tablet TAKE ONE TABLET BY MOUTH ONCE DAILY 90 tablet 0  . Blood Glucose Calibration  (ADVOCATE CONTROL SOLUTION) High LIQD Use with glucometer to check blood sugar up to four times daily as directed. ICD 10 Dx: E11.59. 1 each 99  . blood glucose meter kit and supplies KIT Dispense based on patient and insurance preference. Use up to four times daily as directed. ICD 10 Dx: E11.59. Please dispense lancets x100 refill 99, test strips x100 refill 99, control solution x1 refill 99, and sharps container 1 each 0  . clopidogrel (PLAVIX) 75 MG tablet Take 1 tablet (75 mg total) by mouth daily. 90 tablet 3  . cyclobenzaprine (FLEXERIL) 10 MG tablet Take 0.5-1 tablets (5-10 mg total) by mouth 3 (three) times daily as needed. One half tab PO qHS, then increase gradually to one tab TID. 90 tablet 1  . furosemide (LASIX) 20 MG tablet Take 1 tablet (20 mg total) by mouth 2 (two) times daily. 60 tablet 3  . glucose blood test strip Use up to 4 times per day as directed with glucometer. Disp: 100. Refill x99 100 each 99  . hydrochlorothiazide (HYDRODIURIL) 25 MG tablet TAKE ONE TABLET BY MOUTH ONCE DAILY 90 tablet 1  . Insulin Glargine (TOUJEO SOLOSTAR) 300 UNIT/ML SOPN Inject 70 Units into the skin daily. Increase stepwise by 5 units at a time, twice per week, until fasting blood sugar is consistently 80-120, then stay at that dose. 5 pen 3  . insulin lispro (HUMALOG) 100 UNIT/ML KiwkPen Inject 0.1 mLs (10 Units total) into the skin daily. Take prior to largest meal of the day 15 mL 11  .  Insulin Pen Needle (BD PEN NEEDLE NANO U/F) 32G X 4 MM MISC Use to check blood sugar up to four times daily as directed. ICD 10 Dx: E11.59. 100 each 4  . JARDIANCE 25 MG TABS tablet TAKE ONE TABLET BY MOUTH ONCE DAILY 30 tablet 1  . Lancet Device MISC Use up to 4 times per day DX: E11.9 400 each 6  . Lancets Misc. (ACCU-CHEK FASTCLIX LANCET) KIT Use to check blood sugar up to four times daily as directed. ICD 10 Dx: E11.59. 1 kit 4  . losartan (COZAAR) 100 MG tablet Take 1 tablet (100 mg total) by mouth daily. 90  tablet 3  . lovastatin (MEVACOR) 10 MG tablet Take 1 tablet (10 mg total) by mouth at bedtime. 90 tablet 3  . naproxen (NAPROSYN) 500 MG tablet Take 1 tablet (500 mg total) by mouth 2 (two) times daily with a meal. As needed for pain 60 tablet 3  . traMADol (ULTRAM) 50 MG tablet Take 1 tablet (50 mg total) by mouth every 8 (eight) hours as needed for severe pain. 15 tablet 0   No current facility-administered medications for this visit.    Allergies  Allergen Reactions  . Statins       Review of Systems:  Constitutional:  No  fever, no chills, No recent illness  HEENT: No  headache  Cardiac: No  chest pain, No  pressure, No palpitations  Respiratory:  No  shortness of breath. No  Cough  Gastrointestinal: No  abdominal pain, No  nausea  Musculoskeletal: No new myalgia/arthralgia  Endocrine: No cold intolerance,  No heat intolerance. No polyuria/polydipsia/polyphagia    Exam:  BP (!) 142/74   Pulse 80   Temp 97.9 F (36.6 C)   Ht 5\' 11"  (1.803 m)   Wt (!) 332 lb 9.6 oz (150.9 kg)   SpO2 98%   BMI 46.39 kg/m   Constitutional: VS see above. General Appearance: alert, well-developed, well-nourished, NAD  Eyes: Normal lids and conjunctive, non-icteric sclera  Ears, Nose, Mouth, Throat: MMM, Normal external inspection ears/nares/mouth/lips/gums.   Neck: No masses, trachea midline.  Respiratory: Normal respiratory effort. no wheeze, no rhonchi, no rales  Cardiovascular: S1/S2 normal, no murmur, no rub/gallop auscultated. RRR.   Psychiatric: Normal judgment/insight. Normal mood and affect. Oriented x3.    Results for orders placed or performed in visit on 07/30/17 (from the past 72 hour(s))  POCT UA - Microalbumin     Status: None   Collection Time: 07/30/17  9:00 AM  Result Value Ref Range   Microalbumin Ur, POC 150 mg/L   Creatinine, POC 50 mg/dL   Albumin/Creatinine Ratio, Urine, POC >300   POCT HgB A1C     Status: None   Collection Time: 07/30/17  9:00 AM   Result Value Ref Range   Hemoglobin A1C 12.5     ASSESSMENT/PLAN:   Type 2 diabetes mellitus with hyperglycemia, with long-term current use of insulin (HCC) - Plan: POCT UA - Microalbumin, POCT HgB A1C, insulin aspart protamine- aspart (NOVOLOG MIX 70/30) (70-30) 100 UNIT/ML injection  Essential hypertension  BMI 40.0-44.9, adult (HCC)  Morbid obesity (HCC)  Need for influenza vaccination - Plan: Flu vaccine HIGH DOSE PF (Fluzone High dose)    Patient Instructions  Plan:  I'd really like to get off 70/30 if we can, but if not, let's at least raise the dose you're taking Be sure to be taking with meals Continue the 44 units in the morning Increase dinnertime dose to  50 units for a few days then 55 for a few days then 60 units I'll reach out a program which will hopefully help with costs  Log sugars: Fasting Before lunch Before dinner Bedtime WRITE THEM DOWN so we can view multiple numbers at once over time rather than scroll through the glucometer   Bring these numbers to the office in 2-3 weeks and let's review      Visit summary with medication list and pertinent instructions was printed for patient to review. All questions at time of visit were answered - patient instructed to contact office with any additional concerns. ER/RTC precautions were reviewed with the patient. Follow-up plan: Return for review blood sugar log in 2-3 weeks with Dr Sheppard Coil.  Note: Total time spent 25 minutes, greater than 50% of the visit was spent face-to-face counseling and coordinating care for the following: The primary encounter diagnosis was Type 2 diabetes mellitus with hyperglycemia, with long-term current use of insulin ([place]). Diagnoses of Essential hypertension, BMI 40.0-44.9, adult ([place]), Morbid obesity ([place]), and Need for influenza vaccination were also pertinent to this visit.Marland Kitchen

## 2017-07-30 NOTE — Patient Instructions (Addendum)
Plan:  I'd really like to get off 70/30 if we can, but if not, let's at least raise the dose you're taking Be sure to be taking with meals Continue the 44 units in the morning Increase dinnertime dose to 50 units for a few days then 55 for a few days then 60 units I'll reach out a program which will hopefully help with costs  Log sugars: Fasting Before lunch Before dinner Bedtime WRITE THEM DOWN so we can view multiple numbers at once over time rather than scroll through the glucometer   Bring these numbers to the office in 2-3 weeks and let's review

## 2017-08-10 ENCOUNTER — Other Ambulatory Visit: Payer: Self-pay | Admitting: Osteopathic Medicine

## 2017-08-10 DIAGNOSIS — I10 Essential (primary) hypertension: Secondary | ICD-10-CM

## 2017-08-13 ENCOUNTER — Ambulatory Visit: Payer: Medicare HMO | Admitting: Osteopathic Medicine

## 2017-08-20 ENCOUNTER — Encounter: Payer: Self-pay | Admitting: Osteopathic Medicine

## 2017-08-20 ENCOUNTER — Ambulatory Visit: Payer: Medicare HMO | Admitting: Osteopathic Medicine

## 2017-08-20 VITALS — BP 145/90 | HR 79

## 2017-08-20 DIAGNOSIS — Z6841 Body Mass Index (BMI) 40.0 and over, adult: Secondary | ICD-10-CM

## 2017-08-20 DIAGNOSIS — N183 Chronic kidney disease, stage 3 unspecified: Secondary | ICD-10-CM

## 2017-08-20 DIAGNOSIS — M545 Low back pain, unspecified: Secondary | ICD-10-CM

## 2017-08-20 DIAGNOSIS — Z794 Long term (current) use of insulin: Secondary | ICD-10-CM

## 2017-08-20 DIAGNOSIS — E1142 Type 2 diabetes mellitus with diabetic polyneuropathy: Secondary | ICD-10-CM | POA: Diagnosis not present

## 2017-08-20 DIAGNOSIS — G8929 Other chronic pain: Secondary | ICD-10-CM

## 2017-08-20 DIAGNOSIS — E1165 Type 2 diabetes mellitus with hyperglycemia: Secondary | ICD-10-CM

## 2017-08-20 DIAGNOSIS — I10 Essential (primary) hypertension: Secondary | ICD-10-CM

## 2017-08-20 NOTE — Progress Notes (Signed)
HPI: Matthew Barton is a 66 y.o. male  who presents to [facility] today, 08/20/17,  for chief complaint of:  Chief Complaint  Patient presents with  . Diabetes  . Hypertension    Diabetes:  last seen 10/19 - visit notes... We have had considerable difficulty getting him appropriate/best medications due to cost issues even though he has insurance. He is back on the 70/30 insulin as this is all he can afford. Blood sugars are poorly controlled, though he does not have any symptoms of polyuria/polydipsia. Back on 70/30 - 44 units bid. No hypoglycemia Glc fasting around 200 or above  We increased PM insulin and he is here to show up Glc records  Today 08/20/17  Up to 60 units of 70/30 qhs, taking 44 units in AM  Fasting Glc looking a little better! Lunch an ddinnertime still typically high       Hypertension: No chest pain, pressure, shortness of breath. BP looks a bit better on recheck.   CKD: Labs reviewed. Recent Cr ok  LBP: stable, recent MRI, would like to avoid surgery, considering newer laser procedure     Past medical, surgical, social and family history reviewed: Patient Active Problem List   Diagnosis Date Noted  . BMI 40.0-44.9, adult ([place]) 04/06/2017  . Beta-blocker intolerance 10/15/2016  . AKI (acute kidney injury) ([place]) 10/15/2016  . Diabetic polyneuropathy associated with type 2 diabetes mellitus ([place]) 07/29/2016  . Uncontrolled hypertension 07/22/2016  . Edema, lower extremity 07/22/2016  . Chronic bilateral low back pain without sciatica 07/22/2016  . Type II diabetes mellitus, uncontrolled ([place]) 07/22/2016  . History of CVA (cerebrovascular accident) 07/22/2016   No past surgical history on file. Social History   Tobacco Use  . Smoking status: Never Smoker  . Smokeless tobacco: Never Used  Substance Use Topics  . Alcohol use: No   No family history on file.   Current medication list and allergy/intolerance  information reviewed:   Current Outpatient Medications  Medication Sig Dispense Refill  . acetaminophen (TYLENOL) 500 MG tablet Take 1,000 mg by mouth every 6 (six) hours as needed.    . Alcohol Swabs (ALCOHOL PREP) PADS Use up to four times a day DX:E11.9 400 each 6  . amLODipine (NORVASC) 10 MG tablet TAKE 1 TABLET BY MOUTH ONCE DAILY 90 tablet 0  . Blood Glucose Calibration (ADVOCATE CONTROL SOLUTION) High LIQD Use with glucometer to check blood sugar up to four times daily as directed. ICD 10 Dx: E11.59. 1 each 99  . blood glucose meter kit and supplies KIT Dispense based on patient and insurance preference. Use up to four times daily as directed. ICD 10 Dx: E11.59. Please dispense lancets x100 refill 99, test strips x100 refill 99, control solution x1 refill 99, and sharps container 1 each 0  . clopidogrel (PLAVIX) 75 MG tablet Take 1 tablet (75 mg total) by mouth daily. 90 tablet 3  . cyclobenzaprine (FLEXERIL) 10 MG tablet Take 0.5-1 tablets (5-10 mg total) by mouth 3 (three) times daily as needed. One half tab PO qHS, then increase gradually to one tab TID. 90 tablet 1  . furosemide (LASIX) 20 MG tablet Take 1 tablet (20 mg total) by mouth 2 (two) times daily. 60 tablet 3  . glucose blood test strip Use up to 4 times per day as directed with glucometer. Disp: 100. Refill x99 100 each 99  . hydrochlorothiazide (HYDRODIURIL) 25 MG tablet TAKE ONE TABLET BY MOUTH ONCE DAILY  90 tablet 1  . insulin aspart protamine- aspart (NOVOLOG MIX 70/30) (70-30) 100 UNIT/ML injection As directed 10 mL 11  . Insulin Pen Needle (BD PEN NEEDLE NANO U/F) 32G X 4 MM MISC Use to check blood sugar up to four times daily as directed. ICD 10 Dx: E11.59. 100 each 4  . Lancet Device MISC Use up to 4 times per day DX: E11.9 400 each 6  . Lancets Misc. (ACCU-CHEK FASTCLIX LANCET) KIT Use to check blood sugar up to four times daily as directed. ICD 10 Dx: E11.59. 1 kit 4  . losartan (COZAAR) 100 MG tablet Take 1 tablet  (100 mg total) by mouth daily. 90 tablet 3  . lovastatin (MEVACOR) 10 MG tablet Take 1 tablet (10 mg total) by mouth at bedtime. 90 tablet 3  . naproxen (NAPROSYN) 500 MG tablet Take 1 tablet (500 mg total) by mouth 2 (two) times daily with a meal. As needed for pain 60 tablet 3  . traMADol (ULTRAM) 50 MG tablet Take 1 tablet (50 mg total) by mouth every 8 (eight) hours as needed for severe pain. 15 tablet 0   No current facility-administered medications for this visit.    Allergies  Allergen Reactions  . Beta Adrenergic Blockers     Severe fatigue   . Metformin And Related Diarrhea  . Statins     Myalgia at higher doses/more potent meds      Review of Systems:  Constitutional:  No  fever, no chills, No recent illness  HEENT: No  headache  Cardiac: No  chest pain, No  pressure, No palpitations  Respiratory:  No  shortness of breath. No  Cough  Gastrointestinal: No  abdominal pain, No  nausea  Musculoskeletal: No new myalgia/arthralgia  Endocrine: No cold intolerance,  No heat intolerance. No polyuria/polydipsia/polyphagia    Exam:  BP (!) 145/90   Pulse 79   Constitutional: VS see above. General Appearance: alert, well-developed, well-nourished, NAD  Eyes: Normal lids and conjunctive, non-icteric sclera  Ears, Nose, Mouth, Throat: MMM, Normal external inspection ears/nares/mouth/lips/gums.   Neck: No masses, trachea midline.  Respiratory: Normal respiratory effort. no wheeze, no rhonchi, no rales  Cardiovascular: S1/S2 normal, no murmur, no rub/gallop auscultated. RRR.   Psychiatric: Normal judgment/insight. Normal mood and affect. Oriented x3.   Recent Results (from the past 2160 hour(s))  POCT UA - Microalbumin     Status: None   Collection Time: 07/30/17  9:00 AM  Result Value Ref Range   Microalbumin Ur, POC 150 mg/L   Creatinine, POC 50 mg/dL   Albumin/Creatinine Ratio, Urine, POC >300   POCT HgB A1C     Status: None   Collection Time: 07/30/17  9:00  AM  Result Value Ref Range   Hemoglobin A1C 12.5      ASSESSMENT/PLAN:   Type 2 diabetes mellitus with hyperglycemia, with long-term current use of insulin ([place]) - advised on limiting carbs, transition to higher fiber/complex carbs, titrate up on AM 70/30  Essential hypertension - continue current meds for now. Weight loss and diet emphasized, he's reluctant to add medications   BMI 40.0-44.9, adult (HCC)  CKD (chronic kidney disease), stage III (HCC) - on ARB, (+)proteinuria  Diabetic polyneuropathy associated with type 2 diabetes mellitus (HCC)  Chronic bilateral low back pain without sciatica - considering laser procedure next year     Patient Instructions  Plan: Increase morning insulin to maximum of 60 Watch the carbs through the holidays See me in January and  we'll check the A1C     Visit summary with medication list and pertinent instructions was printed for patient to review. All questions at time of visit were answered - patient instructed to contact office with any additional concerns. ER/RTC precautions were reviewed with the patient. Follow-up plan: Return in about 2 months (around 11/01/2017) for recheck A1C, sooner if needed .  Note: Total time spent 25 minutes, greater than 50% of the visit was spent face-to-face counseling and coordinating care for the following: The primary encounter diagnosis was Type 2 diabetes mellitus with hyperglycemia, with long-term current use of insulin ([place]). Diagnoses of Essential hypertension, BMI 40.0-44.9, adult ([place]), CKD (chronic kidney disease), stage III ([place]), Diabetic polyneuropathy associated with type 2 diabetes mellitus ([place]), and Chronic bilateral low back pain without sciatica were also pertinent to this visit.Marland Kitchen

## 2017-08-20 NOTE — Patient Instructions (Addendum)
Plan: Increase morning insulin to maximum of 60 Watch the carbs through the holidays See me in January and we'll check the A1C

## 2017-08-30 ENCOUNTER — Other Ambulatory Visit: Payer: Self-pay | Admitting: Osteopathic Medicine

## 2017-11-02 ENCOUNTER — Ambulatory Visit (INDEPENDENT_AMBULATORY_CARE_PROVIDER_SITE_OTHER): Payer: Medicare Other | Admitting: Osteopathic Medicine

## 2017-11-02 ENCOUNTER — Encounter: Payer: Self-pay | Admitting: Osteopathic Medicine

## 2017-11-02 VITALS — BP 156/78 | HR 75 | Wt 351.2 lb

## 2017-11-02 DIAGNOSIS — N183 Chronic kidney disease, stage 3 unspecified: Secondary | ICD-10-CM

## 2017-11-02 DIAGNOSIS — Z6841 Body Mass Index (BMI) 40.0 and over, adult: Secondary | ICD-10-CM | POA: Diagnosis not present

## 2017-11-02 DIAGNOSIS — Z139 Encounter for screening, unspecified: Secondary | ICD-10-CM | POA: Diagnosis not present

## 2017-11-02 DIAGNOSIS — E1165 Type 2 diabetes mellitus with hyperglycemia: Secondary | ICD-10-CM

## 2017-11-02 DIAGNOSIS — Z789 Other specified health status: Secondary | ICD-10-CM | POA: Diagnosis not present

## 2017-11-02 DIAGNOSIS — I10 Essential (primary) hypertension: Secondary | ICD-10-CM | POA: Diagnosis not present

## 2017-11-02 LAB — POCT GLYCOSYLATED HEMOGLOBIN (HGB A1C): Hemoglobin A1C: 13.2

## 2017-11-02 MED ORDER — INSULIN LISPRO 100 UNIT/ML (KWIKPEN): 10.0000 [IU] | PEN_INJECTOR | Freq: Three times a day (TID) | SUBCUTANEOUS | 11 refills | Status: DC

## 2017-11-02 MED ORDER — INSULIN GLARGINE 300 UNIT/ML ~~LOC~~ SOPN: 60.0000 [IU] | PEN_INJECTOR | Freq: Every day | SUBCUTANEOUS | 6 refills | Status: DC

## 2017-11-02 NOTE — Patient Instructions (Addendum)
For Diabetes  Continue oral medications as you are taking them  Measure fasting blood sugar every day: goal for now is to get this to 100-130  Increase daily basal Insulin (Toujeo) dose by 3 units at a time, twice per week, until fasting sugars are consistently 100-130, then continue at that dose  Start mealtime Insulin (Humalog or whatever your insurance ends up covering) right before every meal, 10 units  Plan to recheck A1C in 3 months  Plan to follow-up in the office sooner if you experience low sugars   Plan to follow-up in the office sooner if your fasting sugars are consistently high despite new insulin dosing   Bring all sugar readings with you to your office visits  For Blood Pressure  If labs ok, we can increase Lasix, will call you

## 2017-11-02 NOTE — Progress Notes (Signed)
HPI: Matthew Barton is a 67 y.o. male  who presents to [facility] today, 11/02/17,  for chief complaint of:  DM2 f/u    Diabetes: great difficulty getting Glc under control, he can only afford 70/30 insulin  07/30/17 -   A1C 12.5% Back on 70/30 - 44 units bid. No hypoglycemia Glc fasting around 200 or above  We increased PM insulin previous visit  08/20/17  Up to 60 units of 70/30 qhs, taking 44 units in AM  Fasting Glc looking a little better! Lunch and dinnertime still typically high. See photo below for home Glc readings. Advise titrate up on AM insulin up to 60 units. Lifestyle changes reiterated.   11/02/17 A1C 13.2% 70/30, taking 60 Units AM, 55 Units PM  Changed insurance - should be able to afford better insulin      Hypertension: No chest pain, pressure, shortness of breath. BP looks a bit better on recheck.   CKD: Labs reviewed. Recent Cr ok  LBP: stable, recent MRI, would like to avoid surgery, considering newer laser procedure     Past medical, surgical, social and family history reviewed: Patient Active Problem List   Diagnosis Date Noted  . BMI 40.0-44.9, adult ([place]) 04/06/2017  . Beta-blocker intolerance 10/15/2016  . AKI (acute kidney injury) ([place]) 10/15/2016  . Diabetic polyneuropathy associated with type 2 diabetes mellitus ([place]) 07/29/2016  . Uncontrolled hypertension 07/22/2016  . Edema, lower extremity 07/22/2016  . Chronic bilateral low back pain without sciatica 07/22/2016  . Type II diabetes mellitus, uncontrolled ([place]) 07/22/2016  . History of CVA (cerebrovascular accident) 07/22/2016   No past surgical history on file. Social History   Tobacco Use  . Smoking status: Never Smoker  . Smokeless tobacco: Never Used  Substance Use Topics  . Alcohol use: No   No family history on file.   Current medication list and allergy/intolerance information reviewed:   Current Outpatient Medications   Medication Sig Dispense Refill  . acetaminophen (TYLENOL) 500 MG tablet Take 1,000 mg by mouth every 6 (six) hours as needed.    . Alcohol Swabs (ALCOHOL PREP) PADS Use up to four times a day DX:E11.9 400 each 6  . amLODipine (NORVASC) 10 MG tablet TAKE 1 TABLET BY MOUTH ONCE DAILY 90 tablet 0  . Blood Glucose Calibration (ADVOCATE CONTROL SOLUTION) High LIQD Use with glucometer to check blood sugar up to four times daily as directed. ICD 10 Dx: E11.59. 1 each 99  . blood glucose meter kit and supplies KIT Dispense based on patient and insurance preference. Use up to four times daily as directed. ICD 10 Dx: E11.59. Please dispense lancets x100 refill 99, test strips x100 refill 99, control solution x1 refill 99, and sharps container 1 each 0  . clopidogrel (PLAVIX) 75 MG tablet Take 1 tablet (75 mg total) by mouth daily. 90 tablet 3  . cyclobenzaprine (FLEXERIL) 10 MG tablet Take 0.5-1 tablets (5-10 mg total) by mouth 3 (three) times daily as needed. One half tab PO qHS, then increase gradually to one tab TID. 90 tablet 1  . furosemide (LASIX) 20 MG tablet TAKE 1 TABLET BY MOUTH TWICE DAILY 60 tablet 3  . glucose blood test strip Use up to 4 times per day as directed with glucometer. Disp: 100. Refill x99 100 each 99  . hydrochlorothiazide (HYDRODIURIL) 25 MG tablet TAKE ONE TABLET BY MOUTH ONCE DAILY 90 tablet 1  . insulin aspart protamine- aspart (NOVOLOG MIX 70/30) (70-30) 100  UNIT/ML injection As directed 10 mL 11  . Insulin Pen Needle (BD PEN NEEDLE NANO U/F) 32G X 4 MM MISC Use to check blood sugar up to four times daily as directed. ICD 10 Dx: E11.59. 100 each 4  . Lancet Device MISC Use up to 4 times per day DX: E11.9 400 each 6  . Lancets Misc. (ACCU-CHEK FASTCLIX LANCET) KIT Use to check blood sugar up to four times daily as directed. ICD 10 Dx: E11.59. 1 kit 4  . losartan (COZAAR) 100 MG tablet Take 1 tablet (100 mg total) by mouth daily. 90 tablet 3  . lovastatin (MEVACOR) 10 MG tablet  Take 1 tablet (10 mg total) by mouth at bedtime. 90 tablet 3  . naproxen (NAPROSYN) 500 MG tablet Take 1 tablet (500 mg total) by mouth 2 (two) times daily with a meal. As needed for pain 60 tablet 3  . traMADol (ULTRAM) 50 MG tablet Take 1 tablet (50 mg total) by mouth every 8 (eight) hours as needed for severe pain. 15 tablet 0   No current facility-administered medications for this visit.    Allergies  Allergen Reactions  . Beta Adrenergic Blockers     Severe fatigue   . Metformin And Related Diarrhea  . Statins     Myalgia at higher doses/more potent meds      Review of Systems:  Constitutional:  No  fever, no chills, No recent illness  HEENT: No  headache  Cardiac: No  chest pain, No  pressure, No palpitations  Respiratory:  No  shortness of breath. No  Cough  Gastrointestinal: No  abdominal pain, No  nausea  Musculoskeletal: No new myalgia/arthralgia  Endocrine: No cold intolerance,  No heat intolerance. No polyuria/polydipsia/polyphagia    Exam:  BP (!) 156/78 (BP Location: Left Arm)   Pulse 75   Wt (!) 351 lb 3.2 oz (159.3 kg)   BMI 48.98 kg/m   Constitutional: VS see above. General Appearance: alert, well-developed, well-nourished, NAD  Eyes: Normal lids and conjunctive, non-icteric sclera  Ears, Nose, Mouth, Throat: MMM, Normal external inspection ears/nares/mouth/lips/gums.   Neck: No masses, trachea midline.  Respiratory: Normal respiratory effort. no wheeze, no rhonchi, no rales  Cardiovascular: S1/S2 normal, no murmur, no rub/gallop auscultated. RRR.   Psychiatric: Normal judgment/insight. Normal mood and affect. Oriented x3.   Recent Results (from the past 2160 hour(s))  POCT HgB A1C     Status: None   Collection Time: 11/02/17  8:27 AM  Result Value Ref Range   Hemoglobin A1C 13.2      ASSESSMENT/PLAN:   Uncontrolled type 2 diabetes mellitus with hyperglycemia (HCC) - switch 70/30 to basal/bolus. A1C is no good! Reiterated lifestyle/diet  changes and weight loss. Hopefully new insurance and new insulin will help.  - Plan: POCT HgB A1C, Insulin Glargine (TOUJEO SOLOSTAR) 300 UNIT/ML SOPN, insulin lispro (HUMALOG) 100 UNIT/ML KiwkPen, CBC, COMPLETE METABOLIC PANEL WITH GFR, Lipid panel, TSH  Essential hypertension - if labs ok wil ladjust meds - Plan: CBC, COMPLETE METABOLIC PANEL WITH GFR, Lipid panel  BMI 40.0-44.9, adult (HCC)  CKD (chronic kidney disease), stage III (HCC) - Plan: COMPLETE METABOLIC PANEL WITH GFR  Beta-blocker intolerance    Patient Instructions  For Diabetes  Continue oral medications as you are taking them  Measure fasting blood sugar every day: goal for now is to get this to 100-130  Increase daily basal Insulin (Toujeo) dose by 3 units at a time, twice per week, until fasting sugars are  consistently 100-130, then continue at that dose  Start mealtime Insulin (Humalog or whatever your insurance ends up covering) right before every meal, 10 units  Plan to recheck A1C in 3 months  Plan to follow-up in the office sooner if you experience low sugars   Plan to follow-up in the office sooner if your fasting sugars are consistently high despite new insulin dosing   Bring all sugar readings with you to your office visits  For Blood Pressure  If labs ok, we can increase Lasix, will call you    Meds ordered this encounter  Medications  . Insulin Glargine (TOUJEO SOLOSTAR) 300 UNIT/ML SOPN    Sig: Inject 60 Units into the skin at bedtime. Increase by 3 units twice per week to target fating Glucose 100-120    Dispense:  4.5 mL    Refill:  6  . insulin lispro (HUMALOG) 100 UNIT/ML KiwkPen    Sig: Inject 0.1 mLs (10 Units total) into the skin 3 (three) times daily before meals.    Dispense:  15 mL    Refill:  11      Visit summary with medication list and pertinent instructions was printed for patient to review. All questions at time of visit were answered - patient instructed to contact office  with any additional concerns. ER/RTC precautions were reviewed with the patient. Follow-up plan: Return in about 3 months (around 01/31/2018) for recheck A1C, sooner to review sugars in one month .  Note: Total time spent 25 minutes, greater than 50% of the visit was spent face-to-face counseling and coordinating care for the following: The primary encounter diagnosis was Uncontrolled type 2 diabetes mellitus with hyperglycemia ([place]). Diagnoses of Essential hypertension, BMI 40.0-44.9, adult ([place]), CKD (chronic kidney disease), stage III ([place]), and Beta-blocker intolerance were also pertinent to this visit.Marland Kitchen

## 2017-11-03 ENCOUNTER — Encounter: Payer: Self-pay | Admitting: Osteopathic Medicine

## 2017-11-03 DIAGNOSIS — E1165 Type 2 diabetes mellitus with hyperglycemia: Secondary | ICD-10-CM | POA: Diagnosis not present

## 2017-11-03 LAB — COMPLETE METABOLIC PANEL WITH GFR
AG Ratio: 1.2 (calc) (ref 1.0–2.5)
ALT: 16 U/L (ref 9–46)
AST: 15 U/L (ref 10–35)
Albumin: 4.1 g/dL (ref 3.6–5.1)
Alkaline phosphatase (APISO): 60 U/L (ref 40–115)
BUN: 16 mg/dL (ref 7–25)
CO2: 31 mmol/L (ref 20–32)
CREATININE: 1.23 mg/dL (ref 0.70–1.25)
Calcium: 9.8 mg/dL (ref 8.6–10.3)
Chloride: 98 mmol/L (ref 98–110)
GFR, Est African American: 70 mL/min/{1.73_m2} (ref >=60)
GFR, Est Non African American: 61 mL/min/{1.73_m2} (ref >=60)
GLOBULIN: 3.3 g/dL (ref 1.9–3.7)
Glucose, Bld: 270 mg/dL — ABNORMAL HIGH (ref 65–99)
Potassium: 4.5 mmol/L (ref 3.5–5.3)
SODIUM: 136 mmol/L (ref 135–146)
Total Bilirubin: 0.5 mg/dL (ref 0.2–1.2)
Total Protein: 7.4 g/dL (ref 6.1–8.1)

## 2017-11-03 LAB — CBC
HEMATOCRIT: 40 % (ref 38.5–50.0)
Hemoglobin: 13 g/dL — ABNORMAL LOW (ref 13.2–17.1)
MCH: 25.4 pg — ABNORMAL LOW (ref 27.0–33.0)
MCHC: 32.5 g/dL (ref 32.0–36.0)
MCV: 78.1 fL — ABNORMAL LOW (ref 80.0–100.0)
MPV: 10.6 fL (ref 7.5–12.5)
PLATELETS: 381 10*3/uL (ref 140–400)
RBC: 5.12 10*6/uL (ref 4.20–5.80)
RDW: 13.1 % (ref 11.0–15.0)
WBC: 8.8 10*3/uL (ref 3.8–10.8)

## 2017-11-03 LAB — LIPID PANEL
CHOL/HDL RATIO: 7.3 (calc) — AB (ref <5.0)
Cholesterol: 212 mg/dL — ABNORMAL HIGH (ref <200)
HDL: 29 mg/dL — AB (ref >40)
LDL CHOLESTEROL (CALC): 140 mg/dL — AB
NON-HDL CHOLESTEROL (CALC): 183 mg/dL — AB (ref <130)
TRIGLYCERIDES: 277 mg/dL — AB (ref <150)

## 2017-11-03 LAB — TSH: TSH: 2.46 mIU/L (ref 0.40–4.50)

## 2017-11-03 MED ORDER — INSULIN ASPART 100 UNIT/ML ~~LOC~~ SOLN: 10.0000 [IU] | Freq: Three times a day (TID) | SUBCUTANEOUS | 99 refills | Status: DC

## 2017-11-14 ENCOUNTER — Other Ambulatory Visit: Payer: Self-pay | Admitting: Osteopathic Medicine

## 2017-11-16 ENCOUNTER — Telehealth: Payer: Self-pay | Admitting: Osteopathic Medicine

## 2017-11-16 DIAGNOSIS — E1165 Type 2 diabetes mellitus with hyperglycemia: Secondary | ICD-10-CM

## 2017-11-16 NOTE — Telephone Encounter (Signed)
Dr. Lyn HollingsheadAlexander    I called [employer] and they stated that all of the Insulins are Tier 3 and that Humalog is preferred but Toujeo and Lantus are on the formulary as well. Due to them being Tier 3 he will have to meet his deductible before the copay will go down. Also the pharmacy is running a 50 day supply and it would be cheaper to do a 30 day supply. - CF

## 2017-11-17 MED ORDER — INSULIN LISPRO 100 UNIT/ML ~~LOC~~ SOLN: 10.0000 [IU] | Freq: Three times a day (TID) | SUBCUTANEOUS | 11 refills | Status: DC

## 2017-11-17 MED ORDER — INSULIN LISPRO 100 UNIT/ML (KWIKPEN): 10.0000 [IU] | PEN_INJECTOR | Freq: Three times a day (TID) | SUBCUTANEOUS | 11 refills | Status: DC

## 2017-11-17 NOTE — Telephone Encounter (Signed)
Thanks for looking into this! [facility] Can we please call the patient and make him aware that it's going to be more expensive until he meets his deductible, but I still think he really needs this medication

## 2017-11-17 NOTE — Telephone Encounter (Signed)
Left VM for Pt to return clinic call.  

## 2017-12-10 ENCOUNTER — Other Ambulatory Visit: Payer: Self-pay | Admitting: Osteopathic Medicine

## 2017-12-10 DIAGNOSIS — I10 Essential (primary) hypertension: Secondary | ICD-10-CM

## 2017-12-20 DIAGNOSIS — E119 Type 2 diabetes mellitus without complications: Secondary | ICD-10-CM | POA: Diagnosis not present

## 2017-12-20 DIAGNOSIS — H2513 Age-related nuclear cataract, bilateral: Secondary | ICD-10-CM | POA: Diagnosis not present

## 2018-01-25 ENCOUNTER — Other Ambulatory Visit: Payer: Self-pay | Admitting: Osteopathic Medicine

## 2018-01-31 ENCOUNTER — Ambulatory Visit (INDEPENDENT_AMBULATORY_CARE_PROVIDER_SITE_OTHER): Payer: Medicare Other | Admitting: Osteopathic Medicine

## 2018-01-31 ENCOUNTER — Telehealth: Payer: Self-pay | Admitting: Osteopathic Medicine

## 2018-01-31 ENCOUNTER — Encounter: Payer: Self-pay | Admitting: Osteopathic Medicine

## 2018-01-31 VITALS — BP 185/93 | HR 81 | Temp 98.0°F | Wt 343.2 lb

## 2018-01-31 DIAGNOSIS — N183 Chronic kidney disease, stage 3 unspecified: Secondary | ICD-10-CM

## 2018-01-31 DIAGNOSIS — Z23 Encounter for immunization: Secondary | ICD-10-CM | POA: Diagnosis not present

## 2018-01-31 DIAGNOSIS — Z1211 Encounter for screening for malignant neoplasm of colon: Secondary | ICD-10-CM | POA: Diagnosis not present

## 2018-01-31 DIAGNOSIS — E1169 Type 2 diabetes mellitus with other specified complication: Secondary | ICD-10-CM

## 2018-01-31 DIAGNOSIS — Z8673 Personal history of transient ischemic attack (TIA), and cerebral infarction without residual deficits: Secondary | ICD-10-CM | POA: Diagnosis not present

## 2018-01-31 DIAGNOSIS — E785 Hyperlipidemia, unspecified: Secondary | ICD-10-CM

## 2018-01-31 DIAGNOSIS — E1165 Type 2 diabetes mellitus with hyperglycemia: Secondary | ICD-10-CM

## 2018-01-31 LAB — POCT GLYCOSYLATED HEMOGLOBIN (HGB A1C): Hemoglobin A1C: 13.5

## 2018-01-31 MED ORDER — INSULIN GLARGINE 300 UNIT/ML ~~LOC~~ SOPN: 80.0000 [IU] | PEN_INJECTOR | Freq: Every day | SUBCUTANEOUS | 11 refills | Status: AC

## 2018-01-31 MED ORDER — EVOLOCUMAB 140 MG/ML ~~LOC~~ SOAJ: 140.0000 mg | SUBCUTANEOUS | 11 refills | Status: DC

## 2018-01-31 MED ORDER — INSULIN REGULAR HUMAN 100 UNIT/ML IJ SOLN: 10.0000 [IU] | Freq: Three times a day (TID) | INTRAMUSCULAR | 11 refills | Status: AC

## 2018-01-31 NOTE — Progress Notes (Signed)
HPI: Matthew Barton is a 67 y.o. male  who presents to [facility] today, 01/31/18,  for chief complaint of:  DM2 f/u    Diabetes: great difficulty getting Glc under control, he can only afford 70/30 insulin  07/30/17 -   A1C 12.5% Back on 70/30 - 44 units bid. No hypoglycemia Glc fasting around 200 or above  We increased PM insulin previous visit  08/20/17  Up to 60 units of 70/30 qhs, taking 44 units in AM  Fasting Glc looking a little better! Lunch and dinnertime still typically high. Advise titrate up on AM insulin up to 60 units. Lifestyle changes reiterated.   11/02/17 A1C 13.2% 70/30, taking 60 Units AM, 55 Units PM  Changed insurance - should be able to afford better insulin  Toujeo samples provided, 70/30 was left on his list (have made attempts to switch him before and no dice)   01/31/18 A1C 13.5% Never picked up the Humalog  Is taking 80 units daily Toujeo Fasting Glc routinely into the 200's  See A/P: Rx for    Hypertension: No chest pain, pressure, shortness of breath. Stressful morning - little sleep and got pulled over on his way here. BP looks a bit better on recheck.    CKD: Labs reviewed. Recent Cr ok  Lower Back Pain: stable, recent MRI, would like to avoid surgery, considering newer laser procedure     Past medical, surgical, social and family history reviewed: Patient Active Problem List   Diagnosis Date Noted  . BMI 40.0-44.9, adult ([place]) 04/06/2017  . Beta-blocker intolerance 10/15/2016  . AKI (acute kidney injury) ([place]) 10/15/2016  . Diabetic polyneuropathy associated with type 2 diabetes mellitus ([place]) 07/29/2016  . Uncontrolled hypertension 07/22/2016  . Edema, lower extremity 07/22/2016  . Chronic bilateral low back pain without sciatica 07/22/2016  . Type II diabetes mellitus, uncontrolled ([place]) 07/22/2016  . History of CVA (cerebrovascular accident) 07/22/2016   No past surgical history on  file. Social History   Tobacco Use  . Smoking status: Never Smoker  . Smokeless tobacco: Never Used  Substance Use Topics  . Alcohol use: No   No family history on file.   Current medication list and allergy/intolerance information reviewed:   Current Outpatient Medications  Medication Sig Dispense Refill  . acetaminophen (TYLENOL) 500 MG tablet Take 1,000 mg by mouth every 6 (six) hours as needed.    . Alcohol Swabs (ALCOHOL PREP) PADS Use up to four times a day DX:E11.9 400 each 6  . amLODipine (NORVASC) 10 MG tablet TAKE 1 TABLET BY MOUTH ONCE DAILY 90 tablet 0  . Blood Glucose Calibration (ADVOCATE CONTROL SOLUTION) High LIQD Use with glucometer to check blood sugar up to four times daily as directed. ICD 10 Dx: E11.59. 1 each 99  . blood glucose meter kit and supplies KIT Dispense based on patient and insurance preference. Use up to four times daily as directed. ICD 10 Dx: E11.59. Please dispense lancets x100 refill 99, test strips x100 refill 99, control solution x1 refill 99, and sharps container 1 each 0  . clopidogrel (PLAVIX) 75 MG tablet TAKE ONE TABLET BY MOUTH ONCE DAILY 90 tablet 3  . cyclobenzaprine (FLEXERIL) 10 MG tablet Take 0.5-1 tablets (5-10 mg total) by mouth 3 (three) times daily as needed. One half tab PO qHS, then increase gradually to one tab TID. 90 tablet 1  . furosemide (LASIX) 20 MG tablet TAKE 1 TABLET BY MOUTH TWICE DAILY 60  tablet 2  . glucose blood test strip Use up to 4 times per day as directed with glucometer. Disp: 100. Refill x99 100 each 99  . hydrochlorothiazide (HYDRODIURIL) 25 MG tablet TAKE 1 TABLET BY MOUTH ONCE DAILY 90 tablet 1  . Insulin Pen Needle (BD PEN NEEDLE NANO U/F) 32G X 4 MM MISC Use to check blood sugar up to four times daily as directed. ICD 10 Dx: E11.59. 100 each 4  . Lancet Device MISC Use up to 4 times per day DX: E11.9 400 each 6  . Lancets Misc. (ACCU-CHEK FASTCLIX LANCET) KIT Use to check blood sugar up to four times daily as  directed. ICD 10 Dx: E11.59. 1 kit 4  . losartan (COZAAR) 100 MG tablet Take 1 tablet (100 mg total) by mouth daily. 90 tablet 3  . lovastatin (MEVACOR) 10 MG tablet Take 1 tablet (10 mg total) by mouth at bedtime. 90 tablet 3  . naproxen (NAPROSYN) 500 MG tablet Take 1 tablet (500 mg total) by mouth 2 (two) times daily with a meal. As needed for pain 60 tablet 3  . traMADol (ULTRAM) 50 MG tablet Take 1 tablet (50 mg total) by mouth every 8 (eight) hours as needed for severe pain. 15 tablet 0  . insulin aspart (NOVOLOG) 100 UNIT/ML injection Inject 10 Units into the skin 3 (three) times daily before meals. (Patient not taking: Reported on 01/31/2018) 3 vial PRN  . insulin aspart protamine- aspart (NOVOLOG MIX 70/30) (70-30) 100 UNIT/ML injection As directed (Patient not taking: Reported on 01/31/2018) 10 mL 11  . Insulin Glargine (TOUJEO SOLOSTAR) 300 UNIT/ML SOPN Inject 60 Units into the skin at bedtime. Increase by 3 units twice per week to target fating Glucose 100-120 (Patient not taking: Reported on 01/31/2018) 4.5 mL 6  . insulin lispro (HUMALOG) 100 UNIT/ML injection Inject 0.1 mLs (10 Units total) into the skin 3 (three) times daily with meals. 9 mL 11   No current facility-administered medications for this visit.    Allergies  Allergen Reactions  . Beta Adrenergic Blockers     Severe fatigue   . Metformin And Related Diarrhea  . Statins Other (See Comments)    Myalgia at higher doses/more potent meds      Review of Systems:  Constitutional:  No  fever, no chills, No recent illness  HEENT: No  headache  Cardiac: No  chest pain, No  pressure, No palpitations  Respiratory:  No  shortness of breath. No  Cough  Gastrointestinal: No  abdominal pain, No  nausea  Musculoskeletal: No new myalgia/arthralgia  Endocrine: No cold intolerance,  No heat intolerance. No polyuria/polydipsia/polyphagia    Exam:  BP (!) 185/93 (BP Location: Right Arm, Patient Position: Sitting, Cuff Size:  Large)   Pulse 81   Temp 98 F (36.7 C) (Oral)   Wt (!) 343 lb 3.2 oz (155.7 kg)   BMI 47.87 kg/m   Constitutional: VS see above. General Appearance: alert, well-developed, well-nourished, NAD  Eyes: Normal lids and conjunctive, non-icteric sclera  Ears, Nose, Mouth, Throat: MMM, Normal external inspection ears/nares/mouth/lips/gums.   Neck: No masses, trachea midline.  Respiratory: Normal respiratory effort. no wheeze, no rhonchi, no rales  Cardiovascular: S1/S2 normal, no murmur, no rub/gallop auscultated. RRR.   Psychiatric: Normal judgment/insight. Normal mood and affect. Oriented x3.   Recent Results (from the past 2160 hour(s))  CBC     Status: Abnormal   Collection Time: 11/03/17  7:46 AM  Result Value Ref Range  WBC 8.8 3.8 - 10.8 Thousand/uL   RBC 5.12 4.20 - 5.80 Million/uL   Hemoglobin 13.0 (L) 13.2 - 17.1 g/dL   HCT 40.0 38.5 - 50.0 %   MCV 78.1 (L) 80.0 - 100.0 fL   MCH 25.4 (L) 27.0 - 33.0 pg   MCHC 32.5 32.0 - 36.0 g/dL   RDW 13.1 11.0 - 15.0 %   Platelets 381 140 - 400 Thousand/uL   MPV 10.6 7.5 - 12.5 fL  COMPLETE METABOLIC PANEL WITH GFR     Status: Abnormal   Collection Time: 11/03/17  7:46 AM  Result Value Ref Range   Glucose, Bld 270 (H) 65 - 99 mg/dL    Comment: .            Fasting reference interval . For someone without known diabetes, a glucose value >125 mg/dL indicates that they may have diabetes and this should be confirmed with a follow-up test. .    BUN 16 7 - 25 mg/dL   Creat 1.23 0.70 - 1.25 mg/dL    Comment: For patients >43 years of age, the reference limit for Creatinine is approximately 13% higher for people identified as African-American. .    GFR, Est Non African American 61 > OR = 60 mL/min/1.99m   GFR, Est African American 70 > OR = 60 mL/min/1.761m  BUN/Creatinine Ratio NOT APPLICABLE 6 - 22 (calc)   Sodium 136 135 - 146 mmol/L   Potassium 4.5 3.5 - 5.3 mmol/L   Chloride 98 98 - 110 mmol/L   CO2 31 20 - 32  mmol/L   Calcium 9.8 8.6 - 10.3 mg/dL   Total Protein 7.4 6.1 - 8.1 g/dL   Albumin 4.1 3.6 - 5.1 g/dL   Globulin 3.3 1.9 - 3.7 g/dL (calc)   AG Ratio 1.2 1.0 - 2.5 (calc)   Total Bilirubin 0.5 0.2 - 1.2 mg/dL   Alkaline phosphatase (APISO) 60 40 - 115 U/L   AST 15 10 - 35 U/L   ALT 16 9 - 46 U/L  Lipid panel     Status: Abnormal   Collection Time: 11/03/17  7:46 AM  Result Value Ref Range   Cholesterol 212 (H) <200 mg/dL   HDL 29 (L) >40 mg/dL   Triglycerides 277 (H) <150 mg/dL   LDL Cholesterol (Calc) 140 (H) mg/dL (calc)    Comment: Reference range: <100 . Desirable range <100 mg/dL for primary prevention;   <70 mg/dL for patients with CHD or diabetic patients  with > or = 2 CHD risk factors. . Marland KitchenDL-C is now calculated using the Martin-Hopkins  calculation, which is a validated novel method providing  better accuracy than the Friedewald equation in the  estimation of LDL-C.  MaCresenciano Genret al. JAAnnamaria [id number];[phone]  (http://education.[employer].com/faq/FAQ164)    Total CHOL/HDL Ratio 7.3 (H) <5.0 (calc)   Non-HDL Cholesterol (Calc) 183 (H) <130 mg/dL (calc)    Comment: For patients with diabetes plus 1 major ASCVD risk  factor, treating to a non-HDL-C goal of <100 mg/dL  (LDL-C of <70 mg/dL) is considered a therapeutic  option.   TSH     Status: None   Collection Time: 11/03/17  7:46 AM  Result Value Ref Range   TSH 2.46 0.40 - 4.50 mIU/L     ASSESSMENT/PLAN:   Uncontrolled type 2 diabetes mellitus with hyperglycemia (HCC) - Plan: POCT HgB A1C, insulin regular (NOVOLIN R,HUMULIN R) 100 units/mL injection, Insulin Glargine (TOUJEO SOLOSTAR) 300 UNIT/ML SOPN, Evolocumab 140 MG/ML  SOAJ  Need for Tdap vaccination - Plan: Tdap vaccine greater than or equal to 7yo IM  Need for pneumococcal vaccination - Plan: Pneumococcal conjugate vaccine 13-valent  Uncontrolled type 2 diabetes mellitus with hyperglycemia ([place]) - multiple comorbids and persistent  uncontrolled A1C .  - Plan: POCT HgB A1C, insulin regular (NOVOLIN R,HUMULIN R) 100 units/mL injection, Insulin Glargine (TOUJEO SOLOSTAR) 300 UNIT/ML SOPN, Evolocumab 140 MG/ML SOAJ  CKD (chronic kidney disease), stage III (HCC)  Morbid obesity (HCC) - lifestyle modifications emphasized again as adjunct to insulin and other meds - Plan: Evolocumab 140 MG/ML SOAJ  Hyperlipidemia associated with type 2 diabetes mellitus ([place]) - consider injectable but cost will likely be an issue - Plan: Evolocumab 140 MG/ML SOAJ  History of cardioembolic cerebrovascular accident (CVA) - Plan: Evolocumab 140 MG/ML SOAJ    Patient Instructions  Plan:  Continue Toujeo at 80 units daily, can increase up to 90 but if this doesn't help the fasting sugars, I'd leave it at 80  Will try another short-acting insulin prescription, though not the preferred one it should still give [place] some meal-time coverage. Try at 10 units just prior to meals and let's see how the A1C looks. It may not make a huge difference in terms of fasting sugars. Try checking your sugars 2 hours after meals.    Meds ordered this encounter  Medications  . insulin regular (NOVOLIN R,HUMULIN R) 100 units/mL injection    Sig: Inject 0.1 mLs (10 Units total) into the skin 3 (three) times daily before meals.    Dispense:  10 mL    Refill:  11  . Insulin Glargine (TOUJEO SOLOSTAR) 300 UNIT/ML SOPN    Sig: Inject 80-90 Units into the skin at bedtime.    Dispense:  4.5 mL    Refill:  11  . Evolocumab 140 MG/ML SOAJ    Sig: Inject 140 mg into the skin every 14 (fourteen) days. Please provide appropriate covered injection pens and other necessary supplies.    Dispense:  2 mL    Refill:  11    Immunization History  Administered Date(s) Administered  . Influenza, High Dose Seasonal PF 07/30/2017  . Influenza,inj,Quad PF,6+ Mos 07/22/2016  . Pneumococcal Conjugate-13 01/31/2018  . Tdap 01/31/2018     Visit summary with medication list and  pertinent instructions was printed for patient to review. All questions at time of visit were answered - patient instructed to contact office with any additional concerns. ER/RTC precautions were reviewed with the patient. Follow-up plan: Return in about 3 months (around 05/02/2018) for recheck A1C, and 2 weeks for recheck BP if needed .  Note: Total time spent 25 minutes, greater than 50% of the visit was spent face-to-face counseling and coordinating care for the following: The primary encounter diagnosis was Uncontrolled type 2 diabetes mellitus with hyperglycemia ([place]). Diagnoses of Need for Tdap vaccination, Need for pneumococcal vaccination, and Uncontrolled type 2 diabetes mellitus with hyperglycemia ([place]) were also pertinent to this visit.Marland Kitchen

## 2018-01-31 NOTE — Patient Instructions (Signed)
Plan:  Continue Toujeo at 80 units daily, can increase up to 90 but if this doesn't help the fasting sugars, I'd leave it at 80  Will try another short-acting insulin prescription, though not the preferred one it should still give us some meal-time coverage. Try at 10 units just prior to meals and let's see how the A1C looks. It may not make a huge difference in terms of fasting sugars. Try checking your sugars 2 hours after meals.

## 2018-01-31 NOTE — Addendum Note (Signed)
Addended by: Deirdre PippinsALEXANDER, Lamar Meter M on: 01/31/2018 03:05 PM   Modules accepted: Orders

## 2018-01-31 NOTE — Telephone Encounter (Signed)
Received fax from [employer] that Repatha requires a PA. Information has been sent to the insurance company. Awaiting determination.   

## 2018-02-01 NOTE — Telephone Encounter (Signed)
Received fax from [employer] that Repatha was approved from 01/31/2018 through 08/02/2018.  Pharmacy notified and forms sent to scan.   Reference ID: [id number].

## 2018-02-14 ENCOUNTER — Ambulatory Visit: Payer: Medicare Other

## 2018-02-14 ENCOUNTER — Telehealth: Payer: Self-pay | Admitting: Emergency Medicine

## 2018-02-14 NOTE — Telephone Encounter (Signed)
Spoke with patient's wife; told her that pt.sheduled for BP check today and had missed his appt.but we would be happy to see him at another time today. She said he had told her he did not want to drive over today. She is a CMA and volunteered to take his BP today and call and tell us what it is. Agreed.

## 2018-03-27 ENCOUNTER — Other Ambulatory Visit: Payer: Self-pay | Admitting: Family Medicine

## 2018-03-27 DIAGNOSIS — I10 Essential (primary) hypertension: Secondary | ICD-10-CM

## 2018-04-25 ENCOUNTER — Other Ambulatory Visit: Payer: Self-pay | Admitting: Osteopathic Medicine

## 2018-04-25 DIAGNOSIS — I10 Essential (primary) hypertension: Secondary | ICD-10-CM

## 2018-05-02 ENCOUNTER — Ambulatory Visit: Payer: Medicare Other | Admitting: Osteopathic Medicine

## 2018-05-05 ENCOUNTER — Other Ambulatory Visit: Payer: Self-pay | Admitting: Osteopathic Medicine

## 2018-05-16 ENCOUNTER — Ambulatory Visit (INDEPENDENT_AMBULATORY_CARE_PROVIDER_SITE_OTHER): Payer: Medicare Other | Admitting: Osteopathic Medicine

## 2018-05-16 ENCOUNTER — Encounter: Payer: Self-pay | Admitting: Osteopathic Medicine

## 2018-05-16 VITALS — BP 166/79 | HR 76 | Temp 98.1°F | Wt 348.6 lb

## 2018-05-16 DIAGNOSIS — M545 Low back pain, unspecified: Secondary | ICD-10-CM

## 2018-05-16 DIAGNOSIS — H6123 Impacted cerumen, bilateral: Secondary | ICD-10-CM

## 2018-05-16 DIAGNOSIS — N183 Chronic kidney disease, stage 3 unspecified: Secondary | ICD-10-CM

## 2018-05-16 DIAGNOSIS — G4700 Insomnia, unspecified: Secondary | ICD-10-CM | POA: Diagnosis not present

## 2018-05-16 DIAGNOSIS — E1169 Type 2 diabetes mellitus with other specified complication: Secondary | ICD-10-CM

## 2018-05-16 DIAGNOSIS — G8929 Other chronic pain: Secondary | ICD-10-CM

## 2018-05-16 DIAGNOSIS — E785 Hyperlipidemia, unspecified: Secondary | ICD-10-CM

## 2018-05-16 DIAGNOSIS — E1165 Type 2 diabetes mellitus with hyperglycemia: Secondary | ICD-10-CM

## 2018-05-16 LAB — POCT GLYCOSYLATED HEMOGLOBIN (HGB A1C): HEMOGLOBIN A1C: 10.1 % — AB (ref 4.0–5.6)

## 2018-05-16 MED ORDER — TRAZODONE HCL 50 MG PO TABS: 25.0000 mg | ORAL_TABLET | Freq: Every evening | ORAL | 0 refills | Status: AC | PRN

## 2018-05-16 MED ORDER — TRAMADOL HCL 50 MG PO TABS: 50.0000 mg | ORAL_TABLET | Freq: Three times a day (TID) | ORAL | 0 refills | Status: DC | PRN

## 2018-05-16 NOTE — Patient Instructions (Signed)
Plan:  OK to refill Tramadol for use sparingly as needed  Try Trazodone for sleep as directed - message me in a month or so with how well this is working for you  Continue diabetes care as best you can - I think we might need to seek an opinion from an endocrinologist if we aren't able to get the sugars under better control   Blood pressure - we may consider restarting the spironolactone, will get blood work today to check kidney function for medication safety

## 2018-05-16 NOTE — Progress Notes (Signed)
HPI: Matthew Barton is a 67 y.o. male  who presents to [facility] today, 05/16/18,  for chief complaint of:  DM2 f/u    Diabetes: great difficulty getting Glc under control, he can only afford 70/30 insulin  07/30/17 -   A1C 12.5% Back on 70/30 - 44 units bid. No hypoglycemia Glc fasting around 200 or above  We increased PM insulin previous visit  08/20/17  Up to 60 units of 70/30 qhs, taking 44 units in AM  Fasting Glc looking a little better! Lunch and dinnertime still typically high. Advise titrate up on AM insulin up to 60 units. Lifestyle changes reiterated.   11/02/17 A1C 13.2% 70/30, taking 60 Units AM, 55 Units PM  Changed insurance - should be able to afford better insulin  Toujeo samples provided, 70/30 was left on his list (have made attempts to switch him before and no dice)   01/31/18 A1C 13.5% Never picked up the Humalog  Is taking 80 units daily Toujeo Fasting Glc routinely into the 200's  See A/P: Rx Toujeo and Novolin R. Also Rx for Repatha injections to help get LDL under control   05/16/18 A1C  10.1% Back to 60 units of the 70/30 bid  Occasionally Novolin use at mealtimes but few lows down to 70 and he is symptomatic when the sugar is down to 70.    Hypertension: No chest pain, pressure, shortness of breath.  Blood pressure sometimes under control, sometimes not.  Patient states he is compliant with medications as noted below.  CKD: Labs reviewed. Recent Cr ok  Lower Back Pain: stable, recent MRI, would like to avoid surgery, considering newer laser procedure but apparently some of these laser centers that he was interested in have closed.  He has a bottle of tramadol with him that I prescribed last year for 15 tablets and he still has some leftovers, he requests a refill of these to have on hand in case he needs it.  Earwax: Reports hearing issue and concern for cerumen impaction.  See assessment/plan a physical  exam below.  Insomnia: Also reports some difficulty sleeping.  Typically gets up around 3 in the morning to use the restroom and then cannot get back to sleep after that.  Has tried limiting liquids prior to bedtime.   Past medical, surgical, social and family history reviewed: Patient Active Problem List   Diagnosis Date Noted  . BMI 40.0-44.9, adult (HCC) 04/06/2017  . Beta-blocker intolerance 10/15/2016  . AKI (acute kidney injury) (HCC) 10/15/2016  . Diabetic polyneuropathy associated with type 2 diabetes mellitus (HCC) 07/29/2016  . Uncontrolled hypertension 07/22/2016  . Edema, lower extremity 07/22/2016  . Chronic bilateral low back pain without sciatica 07/22/2016  . Type II diabetes mellitus, uncontrolled (HCC) 07/22/2016  . History of CVA (cerebrovascular accident) 07/22/2016   No past surgical history on file. Social History   Tobacco Use  . Smoking status: Never Smoker  . Smokeless tobacco: Never Used  Substance Use Topics  . Alcohol use: No   No family history on file.   Current medication list and allergy/intolerance information reviewed. See chart.      Review of Systems:  Constitutional:  No  fever, no chills, No recent illness  HEENT: No  headache  Cardiac: No  chest pain, No  pressure, No palpitations  Respiratory:  No  shortness of breath. No  Cough  Endocrine: No cold intolerance,  No heat intolerance. No polyuria/polydipsia/polyphagia  Exam:  BP (!) 166/79 (BP Location: Left Arm, Patient Position: Sitting, Cuff Size: Large)   Pulse 76   Temp 98.1 F (36.7 C) (Oral)   Wt (!) 348 lb 9.6 oz (158.1 kg)   BMI 48.62 kg/m   Constitutional: VS see above. General Appearance: alert, well-developed, well-nourished, NAD  Eyes: Normal lids and conjunctive, non-icteric sclera  Ears, Nose, Mouth, Throat: MMM, Normal external inspection ears/nares/mouth/lips/gums.  Significant earwax both canals.   Neck: No masses, trachea midline.  Respiratory:  Normal respiratory effort. no wheeze, no rhonchi, no rales  Cardiovascular: S1/S2 normal, no murmur, no rub/gallop auscultated. RRR.   Psychiatric: Normal judgment/insight. Normal mood and affect. Oriented x3.   Recent Results (from the past 2160 hour(s))  POCT HgB A1C     Status: Abnormal   Collection Time: 05/16/18  1:59 PM  Result Value Ref Range   Hemoglobin A1C 10.1 (A) 4.0 - 5.6 %   HbA1c POC (<> result, manual entry)  4.0 - 5.6 %   HbA1c, POC (prediabetic range)  5.7 - 6.4 %   HbA1c, POC (controlled diabetic range)  0.0 - 7.0 %  COMPLETE METABOLIC PANEL WITH GFR     Status: Abnormal   Collection Time: 05/16/18  3:05 PM  Result Value Ref Range   Glucose, Bld 203 (H) 65 - 99 mg/dL    Comment: .            Fasting reference interval . For someone without known diabetes, a glucose value >125 mg/dL indicates that they may have diabetes and this should be confirmed with a follow-up test. .    BUN 19 7 - 25 mg/dL   Creat 6.57 8.46 - 9.62 mg/dL    Comment: For patients >55 years of age, the reference limit for Creatinine is approximately 13% higher for people identified as African-American. .    GFR, Est Non African American 61 > OR = 60 mL/min/1.56m2   GFR, Est African American 71 > OR = 60 mL/min/1.59m2   BUN/Creatinine Ratio NOT APPLICABLE 6 - 22 (calc)   Sodium 136 135 - 146 mmol/L   Potassium 4.2 3.5 - 5.3 mmol/L   Chloride 97 (L) 98 - 110 mmol/L   CO2 31 20 - 32 mmol/L   Calcium 10.1 8.6 - 10.3 mg/dL   Total Protein 7.7 6.1 - 8.1 g/dL   Albumin 4.4 3.6 - 5.1 g/dL   Globulin 3.3 1.9 - 3.7 g/dL (calc)   AG Ratio 1.3 1.0 - 2.5 (calc)   Total Bilirubin 0.4 0.2 - 1.2 mg/dL   Alkaline phosphatase (APISO) 65 40 - 115 U/L   AST 14 10 - 35 U/L   ALT 15 9 - 46 U/L     Indication: Cerumen impaction of the ear(s) Medical necessity statement: On physical examination, cerumen impairs clinically significant portions of the external auditory canal, and tympanic membrane.  Noted obstructive, copious cerumen that cannot be removed without magnification and instrumentations requiring physician skills Consent: Discussed benefits and risks of procedure and verbal consent obtained Procedure: Patient was prepped for the procedure. Utilized an otoscope to assess and take note of the ear canal, the tympanic membrane, and the presence, amount, and placement of the cerumen. Gentle water irrigation and soft plastic curette was utilized to remove cerumen.  Post procedure examination: shows cerumen was completely removed. Patient tolerated procedure well. The patient is made aware that they may experience temporary vertigo, temporary hearing loss, and temporary discomfort. If these symptom last for more  than 24 hours to call the clinic or proceed to the ED.    ASSESSMENT/PLAN:   Uncontrolled type 2 diabetes mellitus with hyperglycemia (HCC) - Plan: POCT HgB A1C, COMPLETE METABOLIC PANEL WITH GFR  Impacted cerumen, bilateral - Plan: Ear Lavage  Insomnia, unspecified type  CKD (chronic kidney disease), stage III (HCC)  Morbid obesity (HCC)  Hyperlipidemia associated with type 2 diabetes mellitus (HCC)  Chronic bilateral low back pain without sciatica    Patient Instructions  Plan:  OK to refill Tramadol for use sparingly as needed  Try Trazodone for sleep as directed - message me in a month or so with how well this is working for you  Continue diabetes care as best you can - I think we might need to seek an opinion from an endocrinologist if we aren't able to get the sugars under better control   Blood pressure - we may consider restarting the spironolactone, will get blood work today to check kidney function for medication safety    Meds ordered this encounter  Medications  . traMADol (ULTRAM) 50 MG tablet    Sig: Take 1 tablet (50 mg total) by mouth every 8 (eight) hours as needed for severe pain.    Dispense:  30 tablet    Refill:  0  . traZODone  (DESYREL) 50 MG tablet    Sig: Take 0.5-2 tablets (25-100 mg total) by mouth at bedtime as needed for sleep.    Dispense:  60 tablet    Refill:  0    Immunization History  Administered Date(s) Administered  . Influenza, High Dose Seasonal PF 07/30/2017  . Influenza,inj,Quad PF,6+ Mos 07/22/2016  . Pneumococcal Conjugate-13 01/31/2018  . Tdap 01/31/2018     Visit summary with medication list and pertinent instructions was printed for patient to review. All questions at time of visit were answered - patient instructed to contact office with any additional concerns. ER/RTC precautions were reviewed with the patient. Follow-up plan: Return in about 2 weeks (around 05/30/2018) for nurse visit recheck BP .  Note: Total time spent 25 minutes, greater than 50% of the visit was spent face-to-face counseling and coordinating care for the following: The primary encounter diagnosis was Uncontrolled type 2 diabetes mellitus with hyperglycemia (HCC). Diagnoses of Impacted cerumen, bilateral, Insomnia, unspecified type, CKD (chronic kidney disease), stage III (HCC), Morbid obesity (HCC), Hyperlipidemia associated with type 2 diabetes mellitus (HCC), and Chronic bilateral low back pain without sciatica were also pertinent to this visit..Marland Kitchen

## 2018-05-17 ENCOUNTER — Other Ambulatory Visit: Payer: Self-pay | Admitting: Osteopathic Medicine

## 2018-05-17 ENCOUNTER — Encounter: Payer: Self-pay | Admitting: Osteopathic Medicine

## 2018-05-17 DIAGNOSIS — E1165 Type 2 diabetes mellitus with hyperglycemia: Secondary | ICD-10-CM

## 2018-05-17 DIAGNOSIS — I10 Essential (primary) hypertension: Secondary | ICD-10-CM

## 2018-05-17 LAB — COMPLETE METABOLIC PANEL WITH GFR
AG Ratio: 1.3 (calc) (ref 1.0–2.5)
ALBUMIN MSPROF: 4.4 g/dL (ref 3.6–5.1)
ALKALINE PHOSPHATASE (APISO): 65 U/L (ref 40–115)
ALT: 15 U/L (ref 9–46)
AST: 14 U/L (ref 10–35)
BILIRUBIN TOTAL: 0.4 mg/dL (ref 0.2–1.2)
BUN: 19 mg/dL (ref 7–25)
CO2: 31 mmol/L (ref 20–32)
CREATININE: 1.22 mg/dL (ref 0.70–1.25)
Calcium: 10.1 mg/dL (ref 8.6–10.3)
Chloride: 97 mmol/L — ABNORMAL LOW (ref 98–110)
GFR, Est African American: 71 mL/min/{1.73_m2} (ref >=60)
GFR, Est Non African American: 61 mL/min/{1.73_m2} (ref >=60)
Globulin: 3.3 g/dL (calc) (ref 1.9–3.7)
Glucose, Bld: 203 mg/dL — ABNORMAL HIGH (ref 65–99)
Potassium: 4.2 mmol/L (ref 3.5–5.3)
Sodium: 136 mmol/L (ref 135–146)
Total Protein: 7.7 g/dL (ref 6.1–8.1)

## 2018-05-17 MED ORDER — SPIRONOLACTONE 50 MG PO TABS: 50.0000 mg | ORAL_TABLET | Freq: Every day | ORAL | 1 refills | Status: DC

## 2018-05-17 NOTE — Progress Notes (Signed)
Sent spironolactone  Recheck labs and BP 1 weeks nurse visit

## 2018-06-01 ENCOUNTER — Ambulatory Visit (INDEPENDENT_AMBULATORY_CARE_PROVIDER_SITE_OTHER): Payer: Medicare Other | Admitting: Osteopathic Medicine

## 2018-06-01 ENCOUNTER — Encounter: Payer: Self-pay | Admitting: Osteopathic Medicine

## 2018-06-01 VITALS — BP 138/80 | HR 76 | Temp 97.8°F | Wt 345.0 lb

## 2018-06-01 DIAGNOSIS — E1165 Type 2 diabetes mellitus with hyperglycemia: Secondary | ICD-10-CM | POA: Diagnosis not present

## 2018-06-01 DIAGNOSIS — N183 Chronic kidney disease, stage 3 unspecified: Secondary | ICD-10-CM

## 2018-06-01 DIAGNOSIS — I10 Essential (primary) hypertension: Secondary | ICD-10-CM

## 2018-06-01 NOTE — Progress Notes (Signed)
HPI: Matthew Barton is a 67 y.o. male who  has a past medical history of Diabetes mellitus without complication ([place]), Hyperlipidemia, Hypertension, and Stroke ([place]).  he presents to [facility] today, 06/01/18,  for chief complaint of:  Recheck BP  We restarted Spironolactone last visit. Lytes and renal functio ok 05/16/18 but he hasn't gotten these rechecked yet.   BP today 138/80 after recheck, was initially high on intake. No new complaints or concerns!      Past medical history, surgical history, and family history reviewed.  Current medication list and allergy/intolerance information reviewed.   (See remainder of HPI, ROS, Phys Exam below)  BP (!) 177/69   Pulse 76   Temp 97.8 F (36.6 C) (Oral)   Wt (!) 345 lb (156.5 kg)   BMI 48.12 kg/m   No results found.  No results found for this or any previous visit (from the past 72 hour(s)).   ASSESSMENT/PLAN:   Essential hypertension  Uncontrolled type 2 diabetes mellitus with hyperglycemia (HCC)  CKD (chronic kidney disease), stage III ([place])   Sent to lab after visit. As long as labs ok can f/u as below.     Follow-up plan: Return for A1C around early 08/2018 - sooner if needed .                                ############################################ ############################################ ############################################ ############################################    Outpatient Encounter Medications as of 06/01/2018  Medication Sig  . acetaminophen (TYLENOL) 500 MG tablet Take 1,000 mg by mouth every 6 (six) hours as needed.  . Alcohol Swabs (ALCOHOL PREP) PADS Use up to four times a day DX:E11.9  . amLODipine (NORVASC) 10 MG tablet TAKE 1 TABLET BY MOUTH ONCE DAILY  . Blood Glucose Calibration (ADVOCATE CONTROL SOLUTION) High LIQD Use with glucometer to check blood sugar up to four times daily as directed. ICD 10 Dx:  E11.59.  . blood glucose meter kit and supplies KIT Dispense based on patient and insurance preference. Use up to four times daily as directed. ICD 10 Dx: E11.59. Please dispense lancets x100 refill 99, test strips x100 refill 99, control solution x1 refill 99, and sharps container  . clopidogrel (PLAVIX) 75 MG tablet TAKE ONE TABLET BY MOUTH ONCE DAILY  . cyclobenzaprine (FLEXERIL) 10 MG tablet Take 0.5-1 tablets (5-10 mg total) by mouth 3 (three) times daily as needed. One half tab PO qHS, then increase gradually to one tab TID.  . furosemide (LASIX) 20 MG tablet TAKE 1 TABLET BY MOUTH TWICE DAILY  . glucose blood test strip Use up to 4 times per day as directed with glucometer. Disp: 100. Refill x99  . hydrochlorothiazide (HYDRODIURIL) 25 MG tablet TAKE 1 TABLET BY MOUTH ONCE DAILY  . Insulin Glargine (TOUJEO SOLOSTAR) 300 UNIT/ML SOPN Inject 80-90 Units into the skin at bedtime.  . Insulin Pen Needle (BD PEN NEEDLE NANO U/F) 32G X 4 MM MISC Use to check blood sugar up to four times daily as directed. ICD 10 Dx: E11.59.  Marland Kitchen insulin regular (NOVOLIN R,HUMULIN R) 100 units/mL injection Inject 0.1 mLs (10 Units total) into the skin 3 (three) times daily before meals.  Elmore Guise Device MISC Use up to 4 times per day DX: E11.9  . Lancets Misc. (ACCU-CHEK FASTCLIX LANCET) KIT Use to check blood sugar up to four times daily as directed. ICD 10 Dx: E11.59.  Marland Kitchen  loratadine (CLARITIN) 10 MG tablet Take by mouth.  . losartan (COZAAR) 100 MG tablet Take 1 tablet (100 mg total) by mouth daily. Pt must keep appt on 05/02/18.  Marland Kitchen lovastatin (MEVACOR) 10 MG tablet Take 1 tablet (10 mg total) by mouth at bedtime. Due for follow up visit  . Multiple Vitamins-Minerals (MULTIVITAMIN WITH MINERALS) tablet Take by mouth.  . naproxen (NAPROSYN) 500 MG tablet Take 1 tablet (500 mg total) by mouth 2 (two) times daily with a meal. As needed for pain  . polyethylene glycol powder (GLYCOLAX/MIRALAX) powder Take by mouth.  .  spironolactone (ALDACTONE) 50 MG tablet Take 1 tablet (50 mg total) by mouth daily.  . traMADol (ULTRAM) 50 MG tablet Take 1 tablet (50 mg total) by mouth every 8 (eight) hours as needed for severe pain.  . traZODone (DESYREL) 50 MG tablet Take 0.5-2 tablets (25-100 mg total) by mouth at bedtime as needed for sleep.   No facility-administered encounter medications on file as of 06/01/2018.    Allergies  Allergen Reactions  . Statins Other (See Comments)    Myalgia at higher doses/more potent meds  . Beta Adrenergic Blockers     Severe fatigue   . Metformin And Related Diarrhea      Review of Systems:  Constitutional: No recent illness  HEENT: No  headache, no vision change  Cardiac: No  chest pain, No  pressure, No palpitations  Respiratory:  No  shortness of breath  Neurologic: No  weakness, No  Dizziness   Exam:  BP (!) 177/69   Pulse 76   Temp 97.8 F (36.6 C) (Oral)   Wt (!) 345 lb (156.5 kg)   BMI 48.12 kg/m   Constitutional: VS see above. General Appearance: alert, well-developed, well-nourished, NAD  Eyes: Normal lids and conjunctive, non-icteric sclera  Ears, Nose, Mouth, Throat: MMM, Normal external inspection ears/nares/mouth/lips/gums.  Neck: No masses, trachea midline.   Respiratory: Normal respiratory effort. no wheeze, no rhonchi, no rales  Cardiovascular: S1/S2 normal, no murmur, no rub/gallop auscultated. RRR.   Musculoskeletal: Gait normal.   Neurological: Normal balance/coordination. No tremor.  Skin: warm, dry, intact.   Psychiatric: Normal judgment/insight. Normal mood and affect. Oriented x3.   Visit summary with medication list and pertinent instructions was printed for patient to review, advised to alert [place] if any changes needed. All questions at time of visit were answered - patient instructed to contact office with any additional concerns. ER/RTC precautions were reviewed with the patient and understanding verbalized.   Follow-up  plan: Return for A1C around early 08/2018 - sooner if needed .  Note: Total time spent 15 minutes, greater than 50% of the visit was spent face-to-face counseling and coordinating care for the following: The primary encounter diagnosis was Essential hypertension. Diagnoses of Uncontrolled type 2 diabetes mellitus with hyperglycemia (HCC) and CKD (chronic kidney disease), stage III ([place]) were also pertinent to this visit.Marland Kitchen  Please note: voice recognition software was used to produce this document, and typos may escape review. Please contact Dr. Sheppard Coil for any needed clarifications.

## 2018-06-02 LAB — BASIC METABOLIC PANEL WITH GFR
BUN / CREAT RATIO: 14 (calc) (ref 6–22)
BUN: 19 mg/dL (ref 7–25)
CHLORIDE: 99 mmol/L (ref 98–110)
CO2: 28 mmol/L (ref 20–32)
CREATININE: 1.36 mg/dL — AB (ref 0.70–1.25)
Calcium: 9.7 mg/dL (ref 8.6–10.3)
GFR, EST AFRICAN AMERICAN: 62 mL/min/{1.73_m2} (ref >=60)
GFR, Est Non African American: 54 mL/min/{1.73_m2} — ABNORMAL LOW (ref >=60)
GLUCOSE: 224 mg/dL — AB (ref 65–99)
Potassium: 4.5 mmol/L (ref 3.5–5.3)
SODIUM: 136 mmol/L (ref 135–146)

## 2018-06-07 ENCOUNTER — Encounter: Payer: Self-pay | Admitting: Osteopathic Medicine

## 2018-06-11 ENCOUNTER — Other Ambulatory Visit: Payer: Self-pay | Admitting: Osteopathic Medicine

## 2018-06-14 ENCOUNTER — Encounter: Payer: Self-pay | Admitting: Osteopathic Medicine

## 2018-06-22 IMAGING — DX DG LUMBAR SPINE COMPLETE 4+V
5 series · 5 of 5 positions shown · non-contrast
Comparison: None.

CLINICAL DATA: Low back pain and tenderness for 1 and half years.
No history of trauma.

EXAM:
LUMBAR SPINE - COMPLETE 4+ VIEW

[l-spine ap]
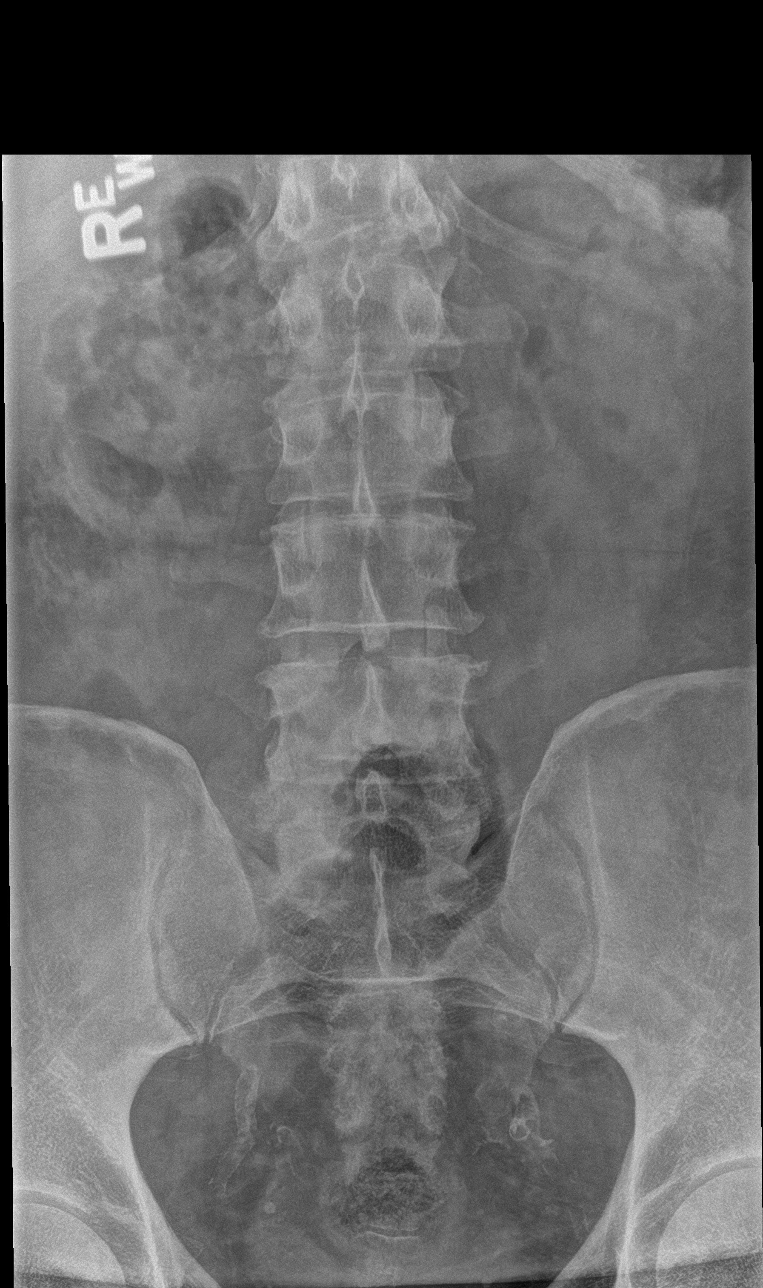

[l-spine obl (1 of 2)]
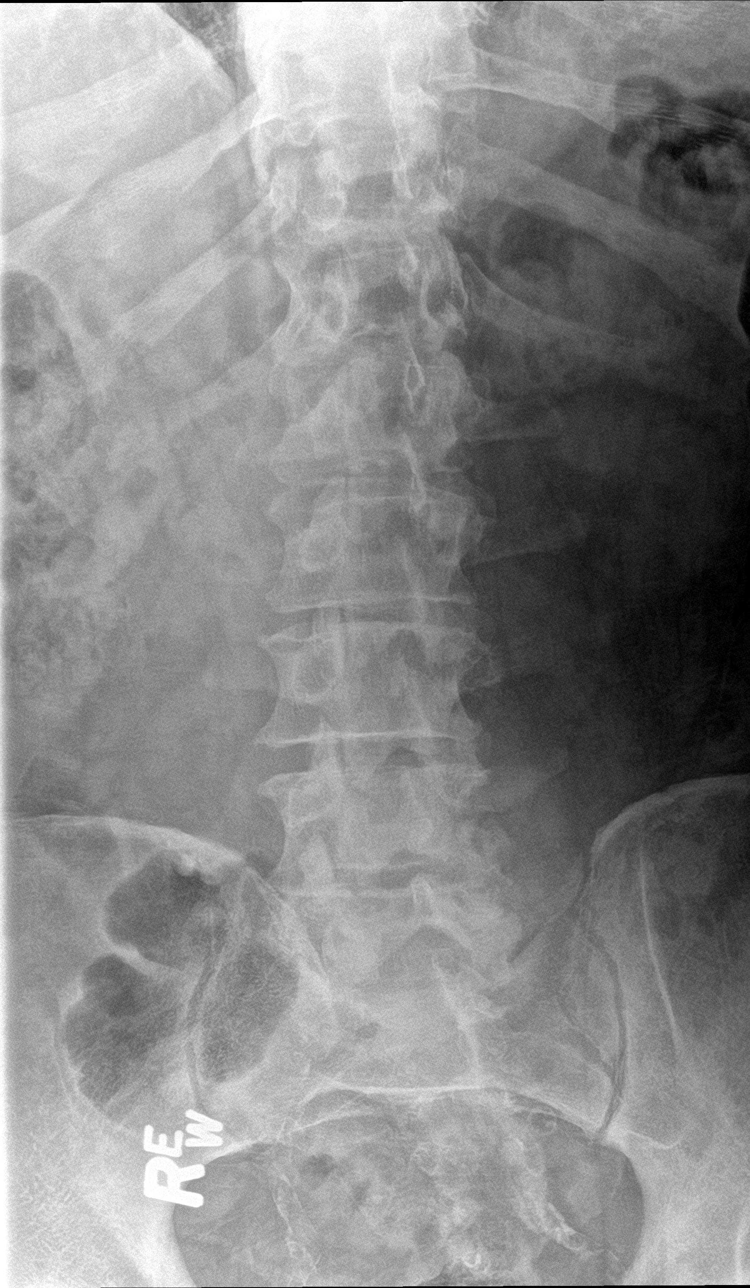

[l-spine obl (2 of 2)]
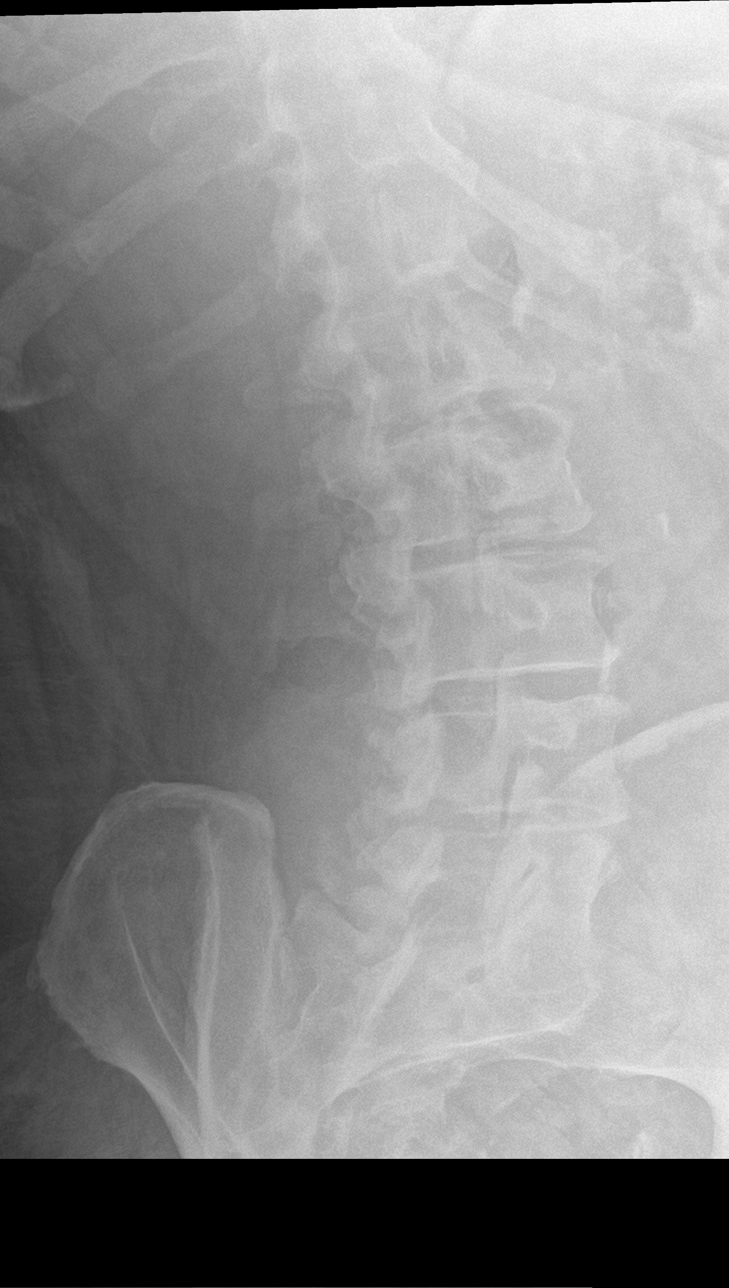

[l-spine lat]
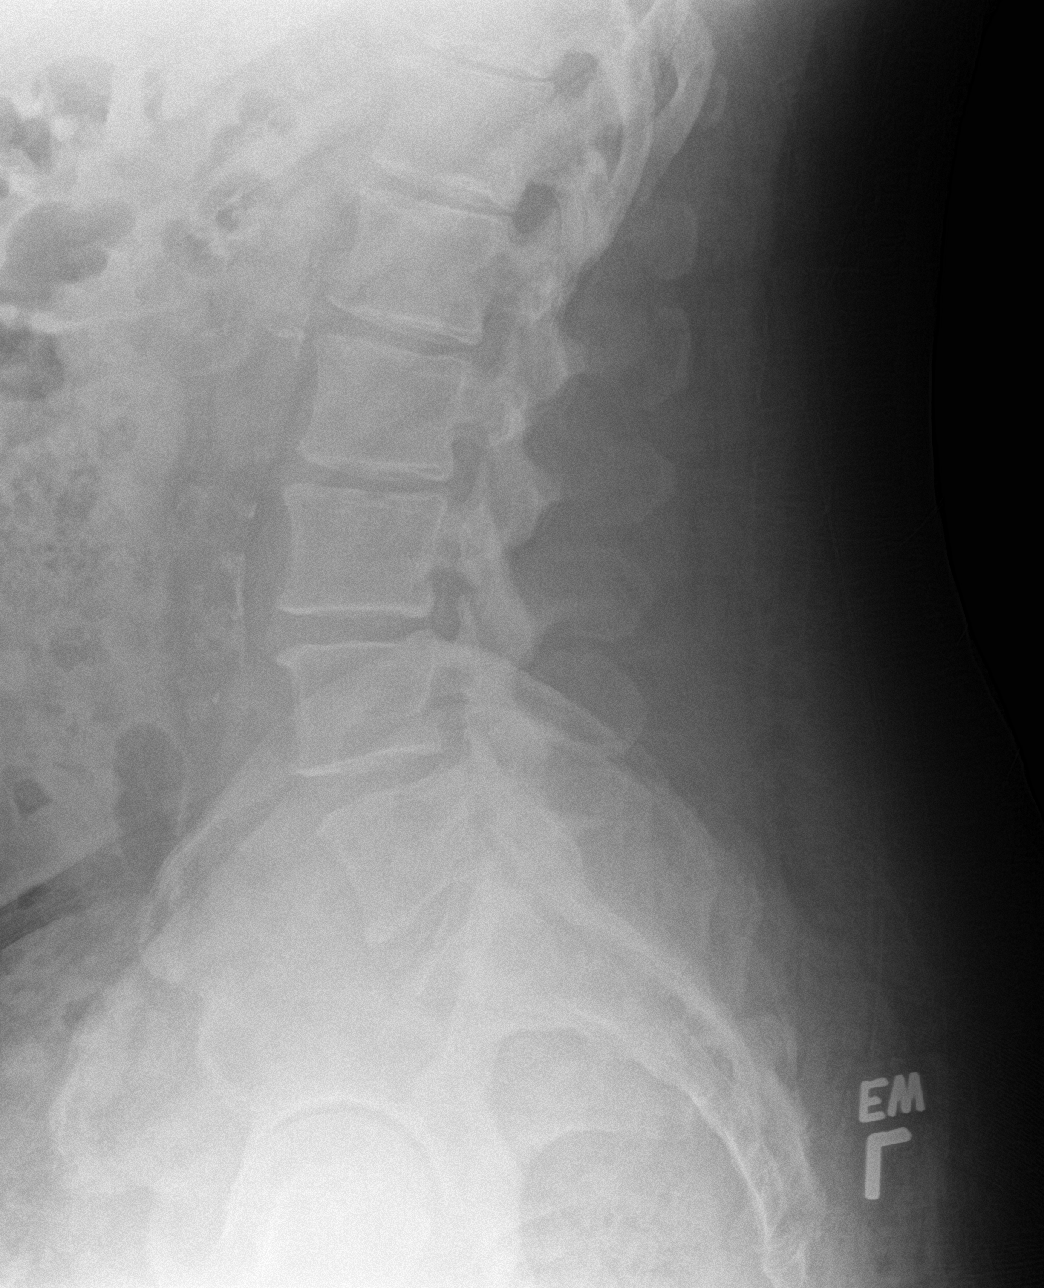

[l-spine spot]
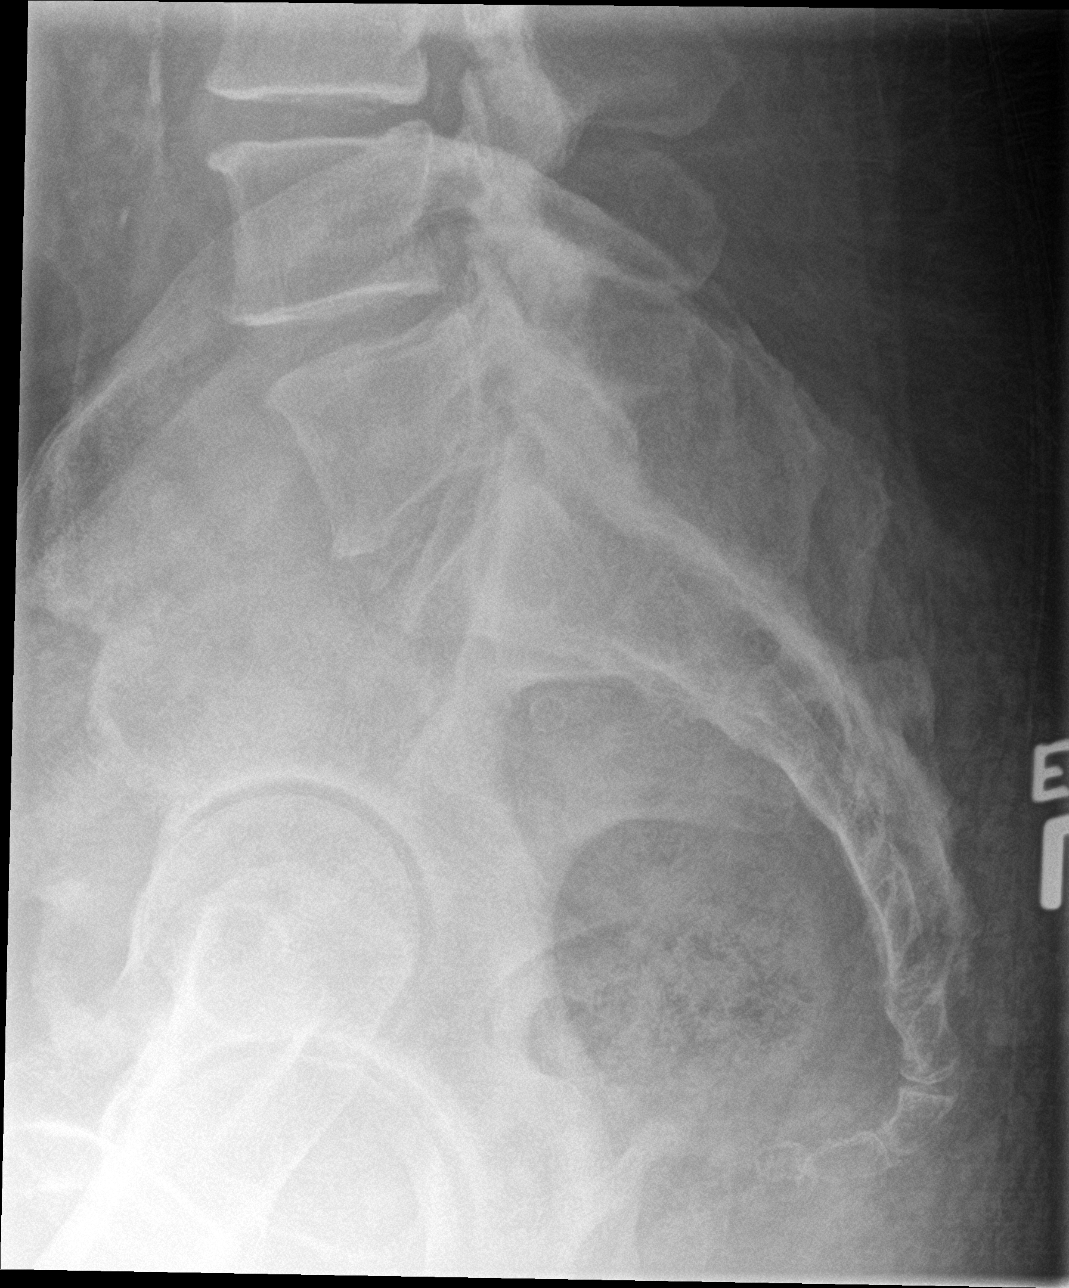

[5 of 5 positions shown; findings below may reference images not displayed]

FINDINGS: No fracture. There is a grade 1 anterolisthesis of L4 on L5. There
is mild loss of disc height from L1-L2 through L4-L5. The L5-S1 disc
is well preserved.

Facet joints are relatively well preserved.

Aortic vascular calcifications. Soft tissues are otherwise
unremarkable.
IMPRESSION: 1. No fracture or acute finding.
2. Mild disc degenerative change as described.

## 2018-07-06 ENCOUNTER — Other Ambulatory Visit: Payer: Self-pay | Admitting: Family Medicine

## 2018-07-06 ENCOUNTER — Other Ambulatory Visit: Payer: Self-pay | Admitting: Osteopathic Medicine

## 2018-07-06 DIAGNOSIS — I10 Essential (primary) hypertension: Secondary | ICD-10-CM

## 2018-07-12 ENCOUNTER — Ambulatory Visit (INDEPENDENT_AMBULATORY_CARE_PROVIDER_SITE_OTHER): Payer: Medicare Other

## 2018-07-12 ENCOUNTER — Ambulatory Visit (INDEPENDENT_AMBULATORY_CARE_PROVIDER_SITE_OTHER): Payer: Medicare Other | Admitting: Family Medicine

## 2018-07-12 ENCOUNTER — Encounter: Payer: Self-pay | Admitting: Family Medicine

## 2018-07-12 VITALS — BP 152/65 | HR 81

## 2018-07-12 DIAGNOSIS — S86012A Strain of left Achilles tendon, initial encounter: Secondary | ICD-10-CM

## 2018-07-12 DIAGNOSIS — W19XXXA Unspecified fall, initial encounter: Secondary | ICD-10-CM | POA: Diagnosis not present

## 2018-07-12 DIAGNOSIS — S99912A Unspecified injury of left ankle, initial encounter: Secondary | ICD-10-CM | POA: Diagnosis not present

## 2018-07-12 DIAGNOSIS — Z23 Encounter for immunization: Secondary | ICD-10-CM

## 2018-07-12 DIAGNOSIS — R600 Localized edema: Secondary | ICD-10-CM | POA: Diagnosis not present

## 2018-07-12 DIAGNOSIS — M7989 Other specified soft tissue disorders: Secondary | ICD-10-CM | POA: Diagnosis not present

## 2018-07-12 NOTE — Progress Notes (Signed)
Matthew Barton is a 67 y.o. male who presents to [place] today for achilles pain.   He has had pain in his left achilles for about 2 weeks. He said he began taking spironolactone and got dizzy and stumbled. When he stumbled he caught himself with his left foot and felt a pop and pain in his achilles area. He had a similar episode about 6 years ago and the pain resolved with ice and rest. He has not taken anything for pain and says he does not like taking pain medicine. He is using a walker to ambulate.     ROS:  As above  Exam:  BP (!) 152/65   Pulse 81  General: Well Developed, well nourished, and in no acute distress.  Neuro/Psych: Alert and oriented x3, extra-ocular muscles intact, able to move all 4 extremities, sensation grossly intact. Skin: Warm and dry, no rashes noted.  Respiratory: Not using accessory muscles, speaking in full sentences, trachea midline.  Cardiovascular: Pulses palpable, no extremity edema. Abdomen: Does not appear distended. Left ankle: 2+ pitting edema left greater than the right. No obvious deformities Tender to light palpation over the achilles tendon. Non tender to palpation over the calcaneus or medial/lateral malleoli  ROM limited by pain and edema in flexion/extension. ROM limited by edema but not pain for eversion/inversion  Pulses intact distally      Lab and Radiology Results Limited musculoskeletal ultrasound shows intact Achilles tendon at the insertion of the calcaneus.  Tendon fibers are disrupted approximately 3 to 4 cm proximal to the insertion of the Achilles tendon with evidence of tear.  Transverse imaging shows that this tear is likely full-thickness.  Hypoechoic fluid marbling along the subcutaneous tissue consistent with edema. Bony structures are normal.  X-ray images of left ankle personally independently reviewed Degenerative changes present in the ankle and foot with no acute  fracture. Await formal radiology review     Assessment and Plan: 67 y.o. male with left achilles pain. His physical exam and ultra sound results are worrisome for a full or partial thickness achilles tendon tear. He is having trouble ambulating and plantar flexing which supports this. Achilles tendonitis is also on the differential. MRI of the ankle is indicated to assess for tendon tear vs tendonitis. We will also obtain an x ray today. In the meantime, he will use a CAM walker boot and should take over the counter medication for pain. He should continue to ice and rest the ankle until the MRI is obtained.   He received the high dose influenza vaccine today.      Orders Placed This Encounter  Procedures  . DG Ankle Complete Left    Standing Status:   Future    Standing Expiration Date:   09/12/2019    Order Specific Question:   Reason for Exam (SYMPTOM  OR DIAGNOSIS REQUIRED)    Answer:   eval ? at tear    Order Specific Question:   Preferred imaging location?    Answer:   [facility]    Order Specific Question:   Radiology Contrast Protocol - do NOT remove file path    Answer:   \\[email or web address].pdf  . MR ANKLE LEFT WO CONTRAST    Standing Status:   Future    Standing Expiration Date:   09/12/2019    Order Specific Question:   What is the patient's sedation requirement?    Answer:   Anti-anxiety    Order Specific  Question:   Does the patient have a pacemaker or implanted devices?    Answer:   No    Order Specific Question:   Preferred imaging location?    Answer:   [facility] (table limit-350lbs)    Order Specific Question:   Radiology Contrast Protocol - do NOT remove file path    Answer:   \\[email or web address]\Radiant\[email or web address].PDF  . Flu vaccine HIGH DOSE PF   No orders of the defined types were placed in this encounter.   Historical information moved to improve visibility of documentation.  Past Medical  History:  Diagnosis Date  . Diabetes mellitus without complication ([place])   . Hyperlipidemia   . Hypertension   . Stroke [facility])    No past surgical history on file. Social History   Tobacco Use  . Smoking status: Never Smoker  . Smokeless tobacco: Never Used  Substance Use Topics  . Alcohol use: No   family history is not on file.  Medications: Current Outpatient Medications  Medication Sig Dispense Refill  . acetaminophen (TYLENOL) 500 MG tablet Take 1,000 mg by mouth every 6 (six) hours as needed.    . Alcohol Swabs (ALCOHOL PREP) PADS Use up to four times a day DX:E11.9 400 each 6  . amLODipine (NORVASC) 10 MG tablet TAKE 1 TABLET BY MOUTH ONCE DAILY 90 tablet 0  . Blood Glucose Calibration (ADVOCATE CONTROL SOLUTION) High LIQD Use with glucometer to check blood sugar up to four times daily as directed. ICD 10 Dx: E11.59. 1 each 99  . blood glucose meter kit and supplies KIT Dispense based on patient and insurance preference. Use up to four times daily as directed. ICD 10 Dx: E11.59. Please dispense lancets x100 refill 99, test strips x100 refill 99, control solution x1 refill 99, and sharps container 1 each 0  . clopidogrel (PLAVIX) 75 MG tablet TAKE ONE TABLET BY MOUTH ONCE DAILY 90 tablet 3  . cyclobenzaprine (FLEXERIL) 10 MG tablet Take 0.5-1 tablets (5-10 mg total) by mouth 3 (three) times daily as needed. One half tab PO qHS, then increase gradually to one tab TID. 90 tablet 1  . furosemide (LASIX) 20 MG tablet TAKE 1 TABLET BY MOUTH TWICE DAILY 60 tablet 3  . glucose blood test strip Use up to 4 times per day as directed with glucometer. Disp: 100. Refill x99 100 each 99  . hydrochlorothiazide (HYDRODIURIL) 25 MG tablet TAKE 1 TABLET BY MOUTH ONCE DAILY 90 tablet 1  . Insulin Glargine (TOUJEO SOLOSTAR) 300 UNIT/ML SOPN Inject 80-90 Units into the skin at bedtime. 4.5 mL 11  . Insulin Pen Needle (BD PEN NEEDLE NANO U/F) 32G X 4 MM MISC Use to check blood sugar up to four times  daily as directed. ICD 10 Dx: E11.59. 100 each 4  . insulin regular (NOVOLIN R,HUMULIN R) 100 units/mL injection Inject 0.1 mLs (10 Units total) into the skin 3 (three) times daily before meals. 10 mL 11  . Lancet Device MISC Use up to 4 times per day DX: E11.9 400 each 6  . Lancets Misc. (ACCU-CHEK FASTCLIX LANCET) KIT Use to check blood sugar up to four times daily as directed. ICD 10 Dx: E11.59. 1 kit 4  . loratadine (CLARITIN) 10 MG tablet Take by mouth.    . losartan (COZAAR) 100 MG tablet Take 1 tablet (100 mg total) by mouth daily. Pt must keep appt on 05/02/18. 90 tablet 0  . lovastatin (MEVACOR) 10 MG tablet TAKE 1 TABLET BY MOUTH AT  BEDTIME . APPOINTMENT REQUIRED FOR FUTURE REFILLS 90 tablet 0  . Multiple Vitamins-Minerals (MULTIVITAMIN WITH MINERALS) tablet Take by mouth.    . naproxen (NAPROSYN) 500 MG tablet Take 1 tablet (500 mg total) by mouth 2 (two) times daily with a meal. As needed for pain 60 tablet 3  . polyethylene glycol powder (GLYCOLAX/MIRALAX) powder Take by mouth.    . traMADol (ULTRAM) 50 MG tablet Take 1 tablet (50 mg total) by mouth every 8 (eight) hours as needed for severe pain. 30 tablet 0  . traZODone (DESYREL) 50 MG tablet Take 0.5-2 tablets (25-100 mg total) by mouth at bedtime as needed for sleep. 60 tablet 0   No current facility-administered medications for this visit.    Allergies  Allergen Reactions  . Statins Other (See Comments)    Myalgia at higher doses/more potent meds  . Beta Adrenergic Blockers     Severe fatigue   . Metformin And Related Diarrhea      Discussed warning signs or symptoms. Please see discharge instructions. Patient expresses understanding.  I personally was present and performed or re-performed the history, physical exam and medical decision-making activities of this service and have verified that the service and findings are accurately documented in the student's note. ___________________________________________ Lynne Leader  M.D., ABFM., CAQSM. Primary Care and Sports Medicine Adjunct Instructor of [place] [employer]

## 2018-07-12 NOTE — Patient Instructions (Addendum)
Thank you for coming in today. Use the walking boot with the walker.  Get xray now and MRI soon.  Recheck a few days after MRI to go over results.   STOP spironolactone.    Achilles Tendon Rupture The Achilles tendon is a cord-like band that connects the muscles of your lower leg (calf) to your heel. An Achilles tendon rupture is an injury that involves a tear in this tendon. This tendon is the most common site of tendon tearing. What are the causes? This condition may be caused by:  Stress from a sudden stretching of the tendon. For example, this may occur when you land from a jump or when your heel drops down into a hole on uneven ground.  A hard, direct hit to the tendon.  Pushing off your foot forcefully, such as when sprinting, jumping, or changing direction while running.  What increases the risk? This condition is more likely to develop in:  Runners.  People who play sports that involve sprinting, running, or jumping.  People who play contact sports.  People with a weak Achilles tendon. Tendons can weaken from aging, repeat injuries, and chronic tendinitis.  Males who are 53-75 years of age, especially those who do not exercise regularly.  What are the signs or symptoms? Symptoms of this condition include:  Hearing a "pop" at the time of injury.  Severe, sudden pain in the back of the ankle.  Swelling and bruising.  Inability to actively point your toes down.  Pain when standing or walking.  A feeling of giving way when you step on the affected side.  How is this diagnosed? This condition is usually diagnosed with a physical exam. During the exam, your health care provider may:  Touch the tendon and the structures around it.  Squeeze your calf to see if your foot moves.  Ask you to point and flex your foot.  Sometimes, tests are done in addition to an exam. Tests may include:  An ultrasound.  An X-ray.  MRI.  How is this treated? This condition  may be treated with:  Ice applied to the area.  Pain medicine.  Rest.  Crutches.  A cast, splint, or other device to keep the ankle from moving (keep it immobilized).  Heel wedges to reduce the stretch on your tendon as it heals.  Surgery. This option may depend on your age and your activity level.  Follow these instructions at home: If you have a splint or brace:  Do not put pressure on any part of the splint until it is fully hardened. This may take several hours.  Wear the splint or brace as told by your health care provider. Remove it only as told by your health care provider.  Loosen the splint or brace if your toes tingle, become numb, or turn cold and blue.  Do not let your splint or brace get wet if it is not waterproof.  Keep the splint or brace clean. If you have a cast:  Do not put pressure on any part of the cast until it is fully hardened. This may take several hours.  Do not stick anything inside the cast to scratch your skin. Doing that increases your risk of infection.  Check the skin around the cast every day. Tell your health care provider about any concerns.  You may put lotion on dry skin around the edges of the cast. Do not put lotion on the skin underneath the cast.  Do not let  your cast get wet if it is not waterproof.  Keep the cast clean. Bathing  Do not take baths, swim, or use a hot tub until your health care provider approves. Ask your health care provider if you can take showers. You may only be allowed to take sponge baths for bathing.  If your cast, splint, or brace is not waterproof, cover it with a watertight covering when you take a bath or a shower. Managing pain, stiffness, and swelling  If directed, apply ice to the injured area. ? Put ice in a plastic bag. ? Place a towel between your skin and the bag. ? Leave the ice on for 20 minutes, 2-3 times a day.  Move your toes often to avoid stiffness and to lessen swelling.  Raise  (elevate) the injured area above the level of your heart while you are sitting or lying down. Do not dangle your leg over a chair, couch, or bed. Driving  Do not drive or operate heavy machinery while taking prescription pain medicine.  Ask your health care provider when it is safe to drive if you have a cast, splint, or brace on a leg or foot that you use for driving. Activity  Return to your normal activities as told by your health care provider. Ask your health care provider what activities are safe for you.  Do exercises only as told by your health care provider. General instructions  Do not use the injured limb to support your body weight until your health care provider says that you can. Use crutches as told by your health care provider.  Do not use any tobacco products, such as cigarettes, chewing tobacco, and e-cigarettes. Tobacco can delay bone healing. If you need help quitting, ask your health care provider.  Take over-the-counter and prescription medicines only as told by your health care provider.  Keep all follow-up visits as told by your health care provider. This is important. How is this prevented?  Warm up and stretch before being active.  Cool down and stretch after being active.  Give your body time to rest between periods of activity.  Make sure to use equipment that fits you.  Be safe and responsible while being active to avoid falls.  Each week, do at least 150 minutes of moderate-intensity exercise, such as brisk walking or water aerobics.  Spread your workouts over the whole week, instead of just working out intensely one or two days of the week.  Make slow, incremental changes in intensity, distance, or time for running or sporting activity.  Maintain physical fitness, including: ? Strength. ? Flexibility. ? Cardiovascular fitness. ? Endurance. Contact a health care provider if:  Your pain and swelling increase.  Your pain is not controlled  with medicines.  You develop new, unexplained symptoms.  Your symptoms get worse.  You cannot move your toes or foot.  You develop warmth and swelling in your foot.  You have an unexplained fever. This information is not intended to replace advice given to you by your health care provider. Make sure you discuss any questions you have with your health care provider. Document Released: 09/28/2005 Document Revised: 06/02/2016 Document Reviewed: 08/21/2015 Elsevier Interactive Patient Education  [employer].

## 2018-07-20 ENCOUNTER — Telehealth: Payer: Self-pay | Admitting: Family Medicine

## 2018-07-20 MED ORDER — LORAZEPAM 0.5 MG PO TABS: ORAL_TABLET | ORAL | 0 refills | Status: AC

## 2018-07-20 NOTE — Telephone Encounter (Signed)
Called patient to advise but could not leave a message because patient mailbox was not set up. Rhonda Cunningham,CMA

## 2018-07-20 NOTE — Telephone Encounter (Signed)
Ativan prescribed for anxiety prior to MRI.

## 2018-07-22 NOTE — Telephone Encounter (Signed)
Repatha has been approved from 07/22/18 through 10/12/2019. Form sent to scan.

## 2018-07-25 ENCOUNTER — Other Ambulatory Visit: Payer: Self-pay

## 2018-07-25 ENCOUNTER — Other Ambulatory Visit: Payer: Self-pay | Admitting: Osteopathic Medicine

## 2018-07-25 DIAGNOSIS — E1165 Type 2 diabetes mellitus with hyperglycemia: Secondary | ICD-10-CM

## 2018-07-25 MED ORDER — GLUCOSE BLOOD VI STRP: ORAL_STRIP | 12 refills | Status: AC

## 2018-08-01 ENCOUNTER — Ambulatory Visit (INDEPENDENT_AMBULATORY_CARE_PROVIDER_SITE_OTHER): Payer: Medicare Other

## 2018-08-01 DIAGNOSIS — S86012D Strain of left Achilles tendon, subsequent encounter: Secondary | ICD-10-CM | POA: Diagnosis not present

## 2018-08-01 DIAGNOSIS — S86012A Strain of left Achilles tendon, initial encounter: Secondary | ICD-10-CM

## 2018-08-01 DIAGNOSIS — X501XXD Overexertion from prolonged static or awkward postures, subsequent encounter: Secondary | ICD-10-CM

## 2018-08-01 DIAGNOSIS — S93402A Sprain of unspecified ligament of left ankle, initial encounter: Secondary | ICD-10-CM | POA: Diagnosis not present

## 2018-08-07 ENCOUNTER — Other Ambulatory Visit: Payer: Self-pay | Admitting: Osteopathic Medicine

## 2018-08-07 DIAGNOSIS — I10 Essential (primary) hypertension: Secondary | ICD-10-CM

## 2018-08-15 ENCOUNTER — Encounter: Payer: Self-pay | Admitting: Osteopathic Medicine

## 2018-08-15 ENCOUNTER — Ambulatory Visit (INDEPENDENT_AMBULATORY_CARE_PROVIDER_SITE_OTHER): Payer: Medicare Other | Admitting: Osteopathic Medicine

## 2018-08-15 VITALS — BP 165/75 | HR 81 | Temp 98.0°F | Wt 342.5 lb

## 2018-08-15 DIAGNOSIS — E1165 Type 2 diabetes mellitus with hyperglycemia: Secondary | ICD-10-CM

## 2018-08-15 DIAGNOSIS — S86011A Strain of right Achilles tendon, initial encounter: Secondary | ICD-10-CM | POA: Diagnosis not present

## 2018-08-15 DIAGNOSIS — S86012D Strain of left Achilles tendon, subsequent encounter: Secondary | ICD-10-CM

## 2018-08-15 LAB — POCT GLYCOSYLATED HEMOGLOBIN (HGB A1C): Hemoglobin A1C: 9.7 % — AB (ref 4.0–5.6)

## 2018-08-15 NOTE — Progress Notes (Signed)
HPI: Matthew Barton is a 67 y.o. male who  has a past medical history of Diabetes mellitus without complication ([place]), Hyperlipidemia, Hypertension, and Stroke ([place]).  he presents to [facility] today, 08/15/18,  for chief complaint of:  DM2 follow-up MRI questions - was seeing Dr Georgina Snell for ankle/heel issue  Diabetes: great difficulty getting Glc under control, he can only afford 70/30 insulin  08/15/18  A1C 9.7% Still on 60 units 70/30 insulin bid Not really using the mealtime insulin more than 2-3 times a month Symptomatic hypoglycemia at <100 Exercise difficult - partial Achilles tendon tear   DM2 history:   05/16/18 A1C  10.1% Back to 60 units of the 70/30 bid  Occasionally Novolin use at mealtimes but few lows down to 70 and he is symptomatic when the sugar is down to 70.   01/31/18 A1C 13.5% Never picked up the Humalog  Is taking 80 units daily Toujeo Fasting Glc routinely into the 200's  See A/P: Rx Toujeo and Novolin R. Also Rx for Repatha injections to help get LDL under control   11/02/17 A1C 13.2% 70/30, taking 60 Units AM, 55 Units PM  Changed insurance - should be able to afford better insulin  Toujeo samples provided, 70/30 was left on his list (have made attempts to switch him before and no dice)   08/20/17  Up to 60 units of 70/30 qhs, taking 44 units in AM  Fasting Glc looking a little better! Lunch and dinnertime still typically high. Advise titrate up on AM insulin up to 60 units. Lifestyle changes reiterated.   07/30/17 -   A1C 12.5% Back on 70/30 - 44 units bid. No hypoglycemia Glc fasting around 200 or above  We increased PM insulin previous visit         Hypertension: Patient reports home blood pressures are measuring in the 989Q systolic, no chest pain pressure or shortness of breath.      Achilles tendon - Dr Georgina Snell saw pt today MRI 07/16/18 IMPRESSION: 1. Non insertional tendinosis and  high-grade partial tearing of the central and medial portions of the Achilles tendon. 2. Mild peroneus brevis tendinosis. 3. Chronic appearing osteochondral lesion of the talar dome laterally. No acute osseous findings. 4. Moderate nonspecific subcutaneous edema within the lower leg.       Patient is accompanied by wife, Hinton Dyer, who assists with history-taking.   Past medical history, surgical history, and family history reviewed.  Current medication list and allergy/intolerance information reviewed.   (See remainder of HPI, ROS, Phys Exam below)  BP Readings from Last 3 Encounters:  08/15/18 (!) 165/75  07/12/18 (!) 152/65  06/01/18 138/80     Results for orders placed or performed in visit on 08/15/18 (from the past 72 hour(s))  POCT HgB A1C     Status: Abnormal   Collection Time: 08/15/18  9:29 AM  Result Value Ref Range   Hemoglobin A1C 9.7 (A) 4.0 - 5.6 %   HbA1c POC (<> result, manual entry)     HbA1c, POC (prediabetic range)     HbA1c, POC (controlled diabetic range)       ASSESSMENT/PLAN:   Uncontrolled type 2 diabetes mellitus with hyperglycemia (HCC) - Plan: POCT HgB A1C       Patient Instructions  Plan:   Physical therapy and home exercises per Dr Clovis Riley recommendations  A1C recheck in 3 mos - use the mealtime insulin at lunch and dinner: 10 units prior to eating  Follow-up plan: Return in about 3 weeks (around 09/05/2018) for DR Brinckerhoff .                   ############################################ ############################################ ############################################ ############################################    Outpatient Encounter Medications as of 08/15/2018  Medication Sig  . ACCU-CHEK AVIVA PLUS test strip USE 1 STRIP TO CHECK GLUCOSE UP TO 4 TIMES DAILY AS  DIRECTED  . acetaminophen (TYLENOL) 500 MG tablet Take 1,000 mg by mouth every 6 (six) hours as needed.  . Alcohol Swabs (ALCOHOL  PREP) PADS Use up to four times a day DX:E11.9  . amLODipine (NORVASC) 10 MG tablet TAKE 1 TABLET BY MOUTH ONCE DAILY  . Blood Glucose Calibration (ADVOCATE CONTROL SOLUTION) High LIQD Use with glucometer to check blood sugar up to four times daily as directed. ICD 10 Dx: E11.59.  . blood glucose meter kit and supplies KIT Dispense based on patient and insurance preference. Use up to four times daily as directed. ICD 10 Dx: E11.59. Please dispense lancets x100 refill 99, test strips x100 refill 99, control solution x1 refill 99, and sharps container  . clopidogrel (PLAVIX) 75 MG tablet TAKE ONE TABLET BY MOUTH ONCE DAILY  . cyclobenzaprine (FLEXERIL) 10 MG tablet Take 0.5-1 tablets (5-10 mg total) by mouth 3 (three) times daily as needed. One half tab PO qHS, then increase gradually to one tab TID.  . furosemide (LASIX) 20 MG tablet TAKE 1 TABLET BY MOUTH TWICE DAILY  . glucose blood test strip Accucheck. Use 4 times daily to check blood sugar as directed with glucometer. Dx:E11.65  . hydrochlorothiazide (HYDRODIURIL) 25 MG tablet TAKE 1 TABLET BY MOUTH ONCE DAILY  . Insulin Glargine (TOUJEO SOLOSTAR) 300 UNIT/ML SOPN Inject 80-90 Units into the skin at bedtime.  . Insulin Pen Needle (BD PEN NEEDLE NANO U/F) 32G X 4 MM MISC Use to check blood sugar up to four times daily as directed. ICD 10 Dx: E11.59.  Marland Kitchen insulin regular (NOVOLIN R,HUMULIN R) 100 units/mL injection Inject 0.1 mLs (10 Units total) into the skin 3 (three) times daily before meals.  Elmore Guise Device MISC Use up to 4 times per day DX: E11.9  . Lancets Misc. (ACCU-CHEK FASTCLIX LANCET) KIT Use to check blood sugar up to four times daily as directed. ICD 10 Dx: E11.59.  Marland Kitchen loratadine (CLARITIN) 10 MG tablet Take by mouth.  Marland Kitchen LORazepam (ATIVAN) 0.5 MG tablet 1-2 tabs 30 - 60 min prior to MRI. Do not drive with this medicine.  Marland Kitchen losartan (COZAAR) 100 MG tablet Take 1 tablet (100 mg total) by mouth daily.  Marland Kitchen lovastatin (MEVACOR) 10 MG tablet  TAKE 1 TABLET BY MOUTH AT BEDTIME . APPOINTMENT REQUIRED FOR FUTURE REFILLS  . Multiple Vitamins-Minerals (MULTIVITAMIN WITH MINERALS) tablet Take by mouth.  . naproxen (NAPROSYN) 500 MG tablet Take 1 tablet (500 mg total) by mouth 2 (two) times daily with a meal. As needed for pain  . polyethylene glycol powder (GLYCOLAX/MIRALAX) powder Take by mouth.  . traMADol (ULTRAM) 50 MG tablet Take 1 tablet (50 mg total) by mouth every 8 (eight) hours as needed for severe pain.  . traZODone (DESYREL) 50 MG tablet Take 0.5-2 tablets (25-100 mg total) by mouth at bedtime as needed for sleep.   No facility-administered encounter medications on file as of 08/15/2018.    Allergies  Allergen Reactions  . Statins Other (See Comments)    Myalgia at higher doses/more potent meds  . Beta Adrenergic Blockers  Severe fatigue   . Metformin And Related Diarrhea      Review of Systems:  Constitutional: No recent illness  HEENT: No  headache, no vision change  Cardiac: No  chest pain, No  pressure, No palpitations  Respiratory:  No  shortness of breath. No  Cough  Gastrointestinal: No  abdominal pain, no change on bowel habits  Musculoskeletal: No new myalgia/arthralgia  Skin: No  Rash  Hem/Onc: No  easy bruising/bleeding, No  abnormal lumps/bumps  Neurologic: No  weakness, No  Dizziness  Psychiatric: No  concerns with depression, No  concerns with anxiety  Exam:  BP (!) 165/75 (BP Location: Left Arm, Patient Position: Sitting, Cuff Size: Large)   Pulse 81   Temp 98 F (36.7 C) (Oral)   Wt (!) 342 lb 8 oz (155.4 kg)   BMI 47.77 kg/m   Constitutional: VS see above. General Appearance: alert, well-developed, well-nourished, NAD  Eyes: Normal lids and conjunctive, non-icteric sclera  Ears, Nose, Mouth, Throat: MMM, Normal external inspection ears/nares/mouth/lips/gums.  Neck: No masses, trachea midline.   Respiratory: Normal respiratory effort. no wheeze, no rhonchi, no  rales  Cardiovascular: S1/S2 normal, no murmur, no rub/gallop auscultated. RRR.   Musculoskeletal: Gait normal. Symmetric and independent movement of all extremities  Neurological: Normal balance/coordination. No tremor.  Skin: warm, dry, intact.   Psychiatric: Normal judgment/insight. Normal mood and affect. Oriented x3.   Visit summary with medication list and pertinent instructions was printed for patient to review, advised to alert [place] if any changes needed. All questions at time of visit were answered - patient instructed to contact office with any additional concerns. ER/RTC precautions were reviewed with the patient and understanding verbalized.   Follow-up plan: Return in about 3 weeks (around 09/05/2018) for DR Johnson. 3 mos A1C recheck w/ Dr Sheppard Coil -bring home BP monitor .    Please note: voice recognition software was used to produce this document, and typos may escape review. Please contact Dr. Sheppard Coil for any needed clarifications.

## 2018-08-15 NOTE — Patient Instructions (Addendum)
Plan:   Physical therapy and home exercises per Dr Zollie Pee recommendations  A1C recheck in 3 mos - use the mealtime insulin at lunch and dinner: 10 units prior to eating

## 2018-09-05 ENCOUNTER — Ambulatory Visit: Payer: Medicare Other | Admitting: Family Medicine

## 2018-09-18 ENCOUNTER — Other Ambulatory Visit: Payer: Self-pay | Admitting: Osteopathic Medicine

## 2018-09-20 ENCOUNTER — Other Ambulatory Visit: Payer: Self-pay | Admitting: Osteopathic Medicine

## 2018-09-20 DIAGNOSIS — I10 Essential (primary) hypertension: Secondary | ICD-10-CM

## 2018-10-11 ENCOUNTER — Other Ambulatory Visit: Payer: Self-pay | Admitting: Osteopathic Medicine

## 2018-10-11 DIAGNOSIS — I10 Essential (primary) hypertension: Secondary | ICD-10-CM

## 2018-10-14 ENCOUNTER — Other Ambulatory Visit: Payer: Self-pay | Admitting: Osteopathic Medicine

## 2018-10-16 ENCOUNTER — Other Ambulatory Visit: Payer: Self-pay | Admitting: Osteopathic Medicine

## 2018-10-16 DIAGNOSIS — I10 Essential (primary) hypertension: Secondary | ICD-10-CM

## 2018-10-21 ENCOUNTER — Encounter: Payer: Self-pay | Admitting: Osteopathic Medicine

## 2018-10-21 MED ORDER — CYCLOBENZAPRINE HCL 10 MG PO TABS: 5.0000 mg | ORAL_TABLET | Freq: Three times a day (TID) | ORAL | 1 refills | Status: DC | PRN

## 2018-11-14 ENCOUNTER — Encounter: Payer: Self-pay | Admitting: Osteopathic Medicine

## 2018-11-14 ENCOUNTER — Ambulatory Visit (INDEPENDENT_AMBULATORY_CARE_PROVIDER_SITE_OTHER): Payer: Medicare Other | Admitting: Osteopathic Medicine

## 2018-11-14 VITALS — BP 161/69 | HR 76 | Temp 98.3°F | Wt 349.3 lb

## 2018-11-14 DIAGNOSIS — E1165 Type 2 diabetes mellitus with hyperglycemia: Secondary | ICD-10-CM

## 2018-11-14 DIAGNOSIS — N183 Chronic kidney disease, stage 3 unspecified: Secondary | ICD-10-CM

## 2018-11-14 DIAGNOSIS — I10 Essential (primary) hypertension: Secondary | ICD-10-CM | POA: Diagnosis not present

## 2018-11-14 LAB — POCT GLYCOSYLATED HEMOGLOBIN (HGB A1C): HEMOGLOBIN A1C: 12 % — AB (ref 4.0–5.6)

## 2018-11-14 MED ORDER — VALSARTAN 320 MG PO TABS: 320.0000 mg | ORAL_TABLET | Freq: Every day | ORAL | 1 refills | Status: DC

## 2018-11-14 NOTE — Patient Instructions (Addendum)
I know finances are an issue, here's what I think I can help with:  Samples of insulin   Checking on insurance coverage since it's a new year: I'd like to get you on better insulin as well as Repatha or similar to help control cholesterol   Will change blood pressure medicines a bit:   Lasix 3 times per day (new Rx sent)  STOP losartan 100 mg, START valsartan 320 mg  Take BP meds in the evenings - can skip dose tomorrow AM and take all meds tomorrow around lunchtime, then the day after can take in the evening and stay on that schedule  Rx to pharmacy for glucose monitor  Here's your homework:  Eat healthy snacks!   Exercise as tolerated, even if just some walking   Please ask your insurance company to fax [place] your formulary for diabetes medicines and cholesterol medicines: [phone] (we will work on this too but doesn't hurt to have you ask as well)

## 2018-11-14 NOTE — Progress Notes (Signed)
HPI: Matthew Barton is a 68 y.o. male who  has a past medical history of Diabetes mellitus without complication ([place]), Hyperlipidemia, Hypertension, and Stroke ([place]).  he presents to [facility] today, 11/14/18,  for chief complaint of:  DM2 follow-up   Diabetes: great difficulty getting Glc under control, he can only afford 70/30 insulin, we have issues samples of Toujeo before but he remains on 70/30.   Today: 11/14/18 A1C 12.0% Reports has been stress eating, has gained some weight  BP also high today, just took meds prior to coming to the office.  taking 50 units 70/30 bid, not taking mealtime insulin - cost concerns     Wt Readings from Last 3 Encounters:  11/14/18 (!) 349 lb 4.8 oz (158.4 kg)  08/15/18 (!) 342 lb 8 oz (155.4 kg)  06/01/18 (!) 345 lb (156.5 kg)   BP Readings from Last 3 Encounters:  11/14/18 (!) 161/69  08/15/18 (!) 165/75  07/12/18 (!) 152/65     DM2 Hx:  08/15/18  A1C 9.7% Still on 60 units 70/30 insulin bid Not really using the mealtime insulin more than 2-3 times a month Symptomatic hypoglycemia at <100 Exercise difficult - partial Achilles tendon tear  Advised mealtime insulin at lunch and dinner - daily   05/16/18 A1C 10.1% Back to 60 units of the 70/30 bid  Occasionally Novolin use at mealtimes but few lows down to 70and he is symptomatic when the sugar is down to 70.   01/31/18 A1C 13.5% Never picked up the Humalog  Is taking 80 units daily Toujeo Fasting Glc routinely into the 200's  See A/P: RxToujeo and Novolin R. Also Rx for Repatha injections to help get LDL under control  11/02/17 A1C 13.2% 70/30, taking 60 Units AM, 55 Units PM  Changed insurance - should be able to afford better insulin  Toujeo samples provided, 70/30 was left on his list (have made attempts to switch him before and no dice)       At today's visit 11/14/18 ... PMH, PSH, FH reviewed and updated as needed.   Current medication list and allergy/intolerance hx reviewed and updated as needed. (See remainder of HPI, ROS, Phys Exam below)         ASSESSMENT/PLAN: The primary encounter diagnosis was Uncontrolled type 2 diabetes mellitus with hyperglycemia ([place]). Diagnoses of CKD (chronic kidney disease), stage III ([place]), Essential hypertension, and Morbid obesity ([place]) were also pertinent to this visit.   Orders Placed This Encounter  Procedures  . POCT HgB A1C     Meds ordered this encounter  Medications  . valsartan (DIOVAN) 320 MG tablet    Sig: Take 1 tablet (320 mg total) by mouth daily.    Dispense:  90 tablet    Refill:  1    CANCEL LOSARTAN thanks    Patient Instructions  I know finances are an issue, here's what I think I can help with:  Samples of insulin   Checking on insurance coverage since it's a new year: I'd like to get you on better insulin as well as Repatha or similar to help control cholesterol   Will change blood pressure medicines a bit:   Lasix 3 times per day (new Rx sent)  STOP losartan 100 mg, START valsartan 320 mg  Take BP meds in the evenings - can skip dose tomorrow AM and take all meds tomorrow around lunchtime, then the day after can take in the evening and stay on that  schedule  Rx to pharmacy for glucose monitor  Here's your homework:  Eat healthy snacks!   Exercise as tolerated, even if just some walking   Please ask your insurance company to fax [place] your formulary for diabetes medicines and cholesterol medicines: [phone] (we will work on this too but doesn't hurt to have you ask as well)      Follow-up plan: Return in about 3 months (around 02/12/2019) for A1C / diabetes monitoring, recheck weight and BP  .                                                 ################################################# ################################################# ################################################# #################################################    Current Meds  Medication Sig  . ACCU-CHEK AVIVA PLUS test strip USE 1 STRIP TO CHECK GLUCOSE UP TO 4 TIMES DAILY AS  DIRECTED  . acetaminophen (TYLENOL) 500 MG tablet Take 1,000 mg by mouth every 6 (six) hours as needed.  . Alcohol Swabs (ALCOHOL PREP) PADS Use up to four times a day DX:E11.9  . amLODipine (NORVASC) 10 MG tablet TAKE 1 TABLET BY MOUTH ONCE DAILY  . Blood Glucose Calibration (ADVOCATE CONTROL SOLUTION) High LIQD Use with glucometer to check blood sugar up to four times daily as directed. ICD 10 Dx: E11.59.  . blood glucose meter kit and supplies KIT Dispense based on patient and insurance preference. Use up to four times daily as directed. ICD 10 Dx: E11.59. Please dispense lancets x100 refill 99, test strips x100 refill 99, control solution x1 refill 99, and sharps container  . clopidogrel (PLAVIX) 75 MG tablet TAKE ONE TABLET BY MOUTH ONCE DAILY  . cyclobenzaprine (FLEXERIL) 10 MG tablet Take 0.5-1 tablets (5-10 mg total) by mouth 3 (three) times daily as needed.  . furosemide (LASIX) 20 MG tablet TAKE 1 TABLET BY MOUTH TWICE DAILY  . glucose blood test strip Accucheck. Use 4 times daily to check blood sugar as directed with glucometer. Dx:E11.65  . hydrochlorothiazide (HYDRODIURIL) 25 MG tablet TAKE 1 TABLET BY MOUTH ONCE DAILY  . Insulin Glargine (TOUJEO SOLOSTAR) 300 UNIT/ML SOPN Inject 80-90 Units into the skin at bedtime.  . Insulin Pen Needle (BD PEN NEEDLE NANO U/F) 32G X 4 MM MISC Use to check blood sugar up to four times daily as directed. ICD 10 Dx: E11.59.  Marland Kitchen insulin regular (NOVOLIN R,HUMULIN R) 100 units/mL injection Inject 0.1 mLs (10 Units total) into the skin 3  (three) times daily before meals.  Elmore Guise Device MISC Use up to 4 times per day DX: E11.9  . Lancets Misc. (ACCU-CHEK FASTCLIX LANCET) KIT Use to check blood sugar up to four times daily as directed. ICD 10 Dx: E11.59.  Marland Kitchen loratadine (CLARITIN) 10 MG tablet Take by mouth.  Marland Kitchen LORazepam (ATIVAN) 0.5 MG tablet 1-2 tabs 30 - 60 min prior to MRI. Do not drive with this medicine.  Marland Kitchen losartan (COZAAR) 100 MG tablet TAKE 1 TABLET BY MOUTH ONCE DAILY  . lovastatin (MEVACOR) 10 MG tablet Take 1 tablet (10 mg total) by mouth at bedtime.  . Multiple Vitamins-Minerals (MULTIVITAMIN WITH MINERALS) tablet Take by mouth.  . naproxen (NAPROSYN) 500 MG tablet Take 1 tablet (500 mg total) by mouth 2 (two) times daily with a meal. As needed for pain  . polyethylene glycol powder (GLYCOLAX/MIRALAX) powder Take by mouth.  . traMADol (ULTRAM) 50 MG tablet  Take 1 tablet (50 mg total) by mouth every 8 (eight) hours as needed for severe pain.  . traZODone (DESYREL) 50 MG tablet Take 0.5-2 tablets (25-100 mg total) by mouth at bedtime as needed for sleep.    Allergies  Allergen Reactions  . Statins Other (See Comments)    Myalgia at higher doses/more potent meds  . Beta Adrenergic Blockers     Severe fatigue   . Metformin And Related Diarrhea       Review of Systems:  Constitutional: No recent illness  HEENT: No  headache, no vision change  Cardiac: No  chest pain, No  pressure, No palpitations  Respiratory:  No  shortness of breath. No  Cough  Gastrointestinal: No  abdominal pain  Skin: No  Rash  Neurologic: No  weakness, No  Dizziness  Psychiatric: No  concerns with depression, +concerns with anxiety  Exam:  BP (!) 166/68 (BP Location: Left Arm, Patient Position: Sitting, Cuff Size: Large)   Pulse 81   Temp 98.3 F (36.8 C) (Oral)   Wt (!) 349 lb 4.8 oz (158.4 kg)   BMI 48.72 kg/m   Constitutional: VS see above. General Appearance: alert, well-developed, well-nourished, NAD  Eyes:  Normal lids and conjunctive, non-icteric sclera  Ears, Nose, Mouth, Throat: MMM, Normal external inspection ears/nares/mouth/lips/gums.  Neck: No masses, trachea midline.   Respiratory: Normal respiratory effort. no wheeze, no rhonchi, no rales  Cardiovascular: S1/S2 normal, no murmur, no rub/gallop auscultated. RRR.   Musculoskeletal: Gait normal. Symmetric and independent movement of all extremities  Neurological: Normal balance/coordination. No tremor.  Skin: warm, dry, intact.   Psychiatric: Normal judgment/insight. Normal mood and affect. Oriented x3.       Visit summary with medication list and pertinent instructions was printed for patient to review, patient was advised to alert [place] if any updates are needed. All questions at time of visit were answered - patient instructed to contact office with any additional concerns. ER/RTC precautions were reviewed with the patient and understanding verbalized.   Note: Total time spent 25 minutes, greater than 50% of the visit was spent face-to-face counseling and coordinating care for the following: The primary encounter diagnosis was Uncontrolled type 2 diabetes mellitus with hyperglycemia ([place]). Diagnoses of CKD (chronic kidney disease), stage III ([place]), Essential hypertension, and Morbid obesity ([place]) were also pertinent to this visit.Marland Kitchen  Please note: voice recognition software was used to produce this document, and typos may escape review. Please contact Dr. Sheppard Coil for any needed clarifications.    Follow up plan: Return in about 3 months (around 02/12/2019) for A1C / diabetes monitoring, recheck weight and BP .

## 2018-11-16 ENCOUNTER — Encounter: Payer: Self-pay | Admitting: Osteopathic Medicine

## 2018-11-16 DIAGNOSIS — E1165 Type 2 diabetes mellitus with hyperglycemia: Secondary | ICD-10-CM

## 2018-11-17 MED ORDER — FREESTYLE LIBRE 14 DAY SENSOR MISC: 1.0000 | 3 refills | Status: DC

## 2018-11-17 MED ORDER — FREESTYLE LIBRE 14 DAY READER DEVI: 1.0000 | 3 refills | Status: AC

## 2018-11-29 ENCOUNTER — Other Ambulatory Visit: Payer: Self-pay | Admitting: Osteopathic Medicine

## 2018-12-09 ENCOUNTER — Other Ambulatory Visit: Payer: Self-pay | Admitting: Osteopathic Medicine

## 2018-12-30 ENCOUNTER — Other Ambulatory Visit: Payer: Self-pay | Admitting: Osteopathic Medicine

## 2018-12-30 NOTE — Telephone Encounter (Signed)
Prescription request routed to Dr Mardelle Matte rx refill pool

## 2019-01-02 ENCOUNTER — Other Ambulatory Visit: Payer: Self-pay | Admitting: Osteopathic Medicine

## 2019-01-02 NOTE — Telephone Encounter (Signed)
Refill request

## 2019-01-18 ENCOUNTER — Other Ambulatory Visit: Payer: Self-pay | Admitting: Osteopathic Medicine

## 2019-01-18 DIAGNOSIS — I10 Essential (primary) hypertension: Secondary | ICD-10-CM

## 2019-01-18 NOTE — Telephone Encounter (Signed)
Routing to Dr Alexander's rx refill pool 

## 2019-02-14 ENCOUNTER — Ambulatory Visit: Payer: Medicare Other | Admitting: Osteopathic Medicine

## 2019-02-17 ENCOUNTER — Other Ambulatory Visit: Payer: Self-pay | Admitting: Osteopathic Medicine

## 2019-04-07 ENCOUNTER — Other Ambulatory Visit: Payer: Self-pay | Admitting: Osteopathic Medicine

## 2019-04-07 DIAGNOSIS — E1165 Type 2 diabetes mellitus with hyperglycemia: Secondary | ICD-10-CM

## 2019-04-07 NOTE — Telephone Encounter (Signed)
[place] requesting new rx for b/s meter.

## 2019-04-08 ENCOUNTER — Other Ambulatory Visit: Payer: Self-pay | Admitting: Osteopathic Medicine

## 2019-04-15 ENCOUNTER — Other Ambulatory Visit: Payer: Self-pay | Admitting: Osteopathic Medicine

## 2019-04-29 ENCOUNTER — Other Ambulatory Visit: Payer: Self-pay | Admitting: Osteopathic Medicine

## 2019-04-29 DIAGNOSIS — I10 Essential (primary) hypertension: Secondary | ICD-10-CM

## 2019-04-30 ENCOUNTER — Other Ambulatory Visit: Payer: Self-pay | Admitting: Osteopathic Medicine

## 2019-04-30 DIAGNOSIS — I10 Essential (primary) hypertension: Secondary | ICD-10-CM

## 2019-05-21 ENCOUNTER — Other Ambulatory Visit: Payer: Self-pay | Admitting: Osteopathic Medicine

## 2019-05-24 ENCOUNTER — Other Ambulatory Visit: Payer: Self-pay | Admitting: Osteopathic Medicine

## 2019-05-24 DIAGNOSIS — E1165 Type 2 diabetes mellitus with hyperglycemia: Secondary | ICD-10-CM

## 2019-05-24 NOTE — Telephone Encounter (Signed)
Please advise 

## 2019-06-04 ENCOUNTER — Other Ambulatory Visit: Payer: Self-pay | Admitting: Osteopathic Medicine

## 2019-06-04 DIAGNOSIS — I10 Essential (primary) hypertension: Secondary | ICD-10-CM

## 2019-06-23 ENCOUNTER — Other Ambulatory Visit: Payer: Self-pay | Admitting: Osteopathic Medicine

## 2019-06-23 NOTE — Telephone Encounter (Signed)
Forwarding medication refill request to PCP for review. 

## 2019-06-26 ENCOUNTER — Encounter: Payer: Self-pay | Admitting: Osteopathic Medicine

## 2019-06-28 ENCOUNTER — Ambulatory Visit (INDEPENDENT_AMBULATORY_CARE_PROVIDER_SITE_OTHER): Payer: Medicare Other | Admitting: Osteopathic Medicine

## 2019-06-28 DIAGNOSIS — E1165 Type 2 diabetes mellitus with hyperglycemia: Secondary | ICD-10-CM

## 2019-06-28 DIAGNOSIS — I10 Essential (primary) hypertension: Secondary | ICD-10-CM

## 2019-06-28 DIAGNOSIS — N183 Chronic kidney disease, stage 3 (moderate): Secondary | ICD-10-CM

## 2019-06-28 NOTE — Progress Notes (Signed)
Unable to reach patient by phone Orders in for labs  HE NEEDS LABS DONE and then can schedule virtual visit Ok to refill meds for 30 days from today 06/28/19

## 2019-06-28 NOTE — Progress Notes (Signed)
Attempted to contact pt at 1255 pm, no answer. Left a detailed vm msg.

## 2019-07-01 ENCOUNTER — Other Ambulatory Visit: Payer: Self-pay | Admitting: Osteopathic Medicine

## 2019-07-03 ENCOUNTER — Other Ambulatory Visit: Payer: Self-pay | Admitting: Osteopathic Medicine

## 2019-07-08 ENCOUNTER — Other Ambulatory Visit: Payer: Self-pay | Admitting: Osteopathic Medicine

## 2019-07-08 DIAGNOSIS — I10 Essential (primary) hypertension: Secondary | ICD-10-CM

## 2019-07-12 ENCOUNTER — Encounter: Payer: Self-pay | Admitting: Osteopathic Medicine

## 2019-07-12 DIAGNOSIS — I10 Essential (primary) hypertension: Secondary | ICD-10-CM

## 2019-07-12 MED ORDER — HYDROCHLOROTHIAZIDE 25 MG PO TABS: ORAL_TABLET | ORAL | 0 refills | Status: DC

## 2019-07-12 MED ORDER — AMLODIPINE BESYLATE 10 MG PO TABS: 10.0000 mg | ORAL_TABLET | Freq: Every day | ORAL | 0 refills | Status: DC

## 2019-07-20 ENCOUNTER — Encounter: Payer: Self-pay | Admitting: Osteopathic Medicine

## 2019-07-20 ENCOUNTER — Other Ambulatory Visit: Payer: Self-pay | Admitting: Osteopathic Medicine

## 2019-07-20 NOTE — Telephone Encounter (Signed)
Requested medication (s) are due for refill today: yes  Requested medication (s) are on the active medication list: yes  Last refill:  04/17/2019  Future visit scheduled: yes  Notes to clinic:  Patient has appointment on 10/21   Requested Prescriptions  Pending Prescriptions Disp Refills   clopidogrel (PLAVIX) 75 MG tablet [Pharmacy Med Name: Clopidogrel Bisulfate 75 MG Oral Tablet] 90 tablet 0    Sig: TAKE 1 TABLET BY MOUTH ONCE DAILY . APPOINTMENT REQUIRED FOR FUTURE REFILLS     Hematology: Antiplatelets - clopidogrel Failed - 07/20/2019 10:53 AM      Failed - Evaluate AST, ALT within 2 months of therapy initiation.      Failed - ALT in normal range and within 360 days    ALT  Date Value Ref Range Status  05/16/2018 15 9 - 46 U/L Final         Failed - AST in normal range and within 360 days    AST  Date Value Ref Range Status  05/16/2018 14 10 - 35 U/L Final         Failed - HCT in normal range and within 180 days    HCT  Date Value Ref Range Status  11/03/2017 40.0 38.5 - 50.0 % Final         Failed - HGB in normal range and within 180 days    Hemoglobin  Date Value Ref Range Status  11/03/2017 13.0 (L) 13.2 - 17.1 g/dL Final         Failed - PLT in normal range and within 180 days    Platelets  Date Value Ref Range Status  11/03/2017 381 140 - 400 Thousand/uL Final         Failed - Valid encounter within last 6 months    Recent Outpatient Visits          3 weeks ago Uncontrolled type 2 diabetes mellitus with hyperglycemia [facility])   [facility], Lanelle Bal, DO   8 months ago Uncontrolled type 2 diabetes mellitus with hyperglycemia [facility])   [facility], Lanelle Bal, DO   11 months ago Uncontrolled type 2 diabetes mellitus with hyperglycemia [facility])   [place], Lanelle Bal, DO   1 year ago Rupture of left Achilles tendon, initial  encounter   [place] [facility], Rebekah [place], MD   1 year ago Essential hypertension   [facility] [place] [facility], [place], DO

## 2019-07-21 ENCOUNTER — Other Ambulatory Visit: Payer: Self-pay | Admitting: Osteopathic Medicine

## 2019-07-21 DIAGNOSIS — E1165 Type 2 diabetes mellitus with hyperglycemia: Secondary | ICD-10-CM

## 2019-07-31 LAB — CBC
HCT: 40.2 % (ref 38.5–50.0)
Hemoglobin: 13.2 g/dL (ref 13.2–17.1)
MCH: 25.7 pg — ABNORMAL LOW (ref 27.0–33.0)
MCHC: 32.8 g/dL (ref 32.0–36.0)
MCV: 78.4 fL — ABNORMAL LOW (ref 80.0–100.0)
MPV: 10.4 fL (ref 7.5–12.5)
Platelets: 429 10*3/uL — ABNORMAL HIGH (ref 140–400)
RBC: 5.13 10*6/uL (ref 4.20–5.80)
RDW: 13.6 % (ref 11.0–15.0)
WBC: 9 10*3/uL (ref 3.8–10.8)

## 2019-07-31 LAB — HEMOGLOBIN A1C
Hgb A1c MFr Bld: 10.7 % of total Hgb — ABNORMAL HIGH (ref <5.7)
Mean Plasma Glucose: 260 (calc)
eAG (mmol/L): 14.4 (calc)

## 2019-07-31 LAB — COMPLETE METABOLIC PANEL WITH GFR
AG Ratio: 1.3 (calc) (ref 1.0–2.5)
ALT: 12 U/L (ref 9–46)
AST: 13 U/L (ref 10–35)
Albumin: 4.3 g/dL (ref 3.6–5.1)
Alkaline phosphatase (APISO): 55 U/L (ref 35–144)
BUN: 21 mg/dL (ref 7–25)
CO2: 26 mmol/L (ref 20–32)
Calcium: 9.2 mg/dL (ref 8.6–10.3)
Chloride: 97 mmol/L — ABNORMAL LOW (ref 98–110)
Creat: 1.24 mg/dL (ref 0.70–1.25)
GFR, Est African American: 69 mL/min/{1.73_m2} (ref >=60)
GFR, Est Non African American: 59 mL/min/{1.73_m2} — ABNORMAL LOW (ref >=60)
Globulin: 3.2 g/dL (calc) (ref 1.9–3.7)
Glucose, Bld: 337 mg/dL — ABNORMAL HIGH (ref 65–99)
Potassium: 4.3 mmol/L (ref 3.5–5.3)
Sodium: 134 mmol/L — ABNORMAL LOW (ref 135–146)
Total Bilirubin: 0.4 mg/dL (ref 0.2–1.2)
Total Protein: 7.5 g/dL (ref 6.1–8.1)

## 2019-07-31 LAB — LIPID PANEL
Cholesterol: 197 mg/dL (ref <200)
HDL: 27 mg/dL — ABNORMAL LOW (ref >=40)
LDL Cholesterol (Calc): 125 mg/dL (calc) — ABNORMAL HIGH
Non-HDL Cholesterol (Calc): 170 mg/dL (calc) — ABNORMAL HIGH (ref <130)
Total CHOL/HDL Ratio: 7.3 (calc) — ABNORMAL HIGH (ref <5.0)
Triglycerides: 318 mg/dL — ABNORMAL HIGH (ref <150)

## 2019-07-31 LAB — PSA, TOTAL WITH REFLEX TO PSA, FREE: PSA, Total: 1.1 ng/mL (ref <=4.0)

## 2019-08-02 ENCOUNTER — Other Ambulatory Visit: Payer: Self-pay

## 2019-08-02 ENCOUNTER — Ambulatory Visit (INDEPENDENT_AMBULATORY_CARE_PROVIDER_SITE_OTHER): Payer: Medicare Other | Admitting: Osteopathic Medicine

## 2019-08-02 ENCOUNTER — Encounter: Payer: Self-pay | Admitting: Osteopathic Medicine

## 2019-08-02 VITALS — BP 181/84 | HR 82 | Temp 98.0°F | Wt 344.1 lb

## 2019-08-02 DIAGNOSIS — I10 Essential (primary) hypertension: Secondary | ICD-10-CM | POA: Diagnosis not present

## 2019-08-02 DIAGNOSIS — E1165 Type 2 diabetes mellitus with hyperglycemia: Secondary | ICD-10-CM | POA: Diagnosis not present

## 2019-08-02 DIAGNOSIS — E785 Hyperlipidemia, unspecified: Secondary | ICD-10-CM

## 2019-08-02 DIAGNOSIS — E1169 Type 2 diabetes mellitus with other specified complication: Secondary | ICD-10-CM

## 2019-08-02 MED ORDER — CYCLOBENZAPRINE HCL 10 MG PO TABS: 5.0000 mg | ORAL_TABLET | Freq: Three times a day (TID) | ORAL | 1 refills | Status: AC | PRN

## 2019-08-02 MED ORDER — FUROSEMIDE 40 MG PO TABS: 40.0000 mg | ORAL_TABLET | Freq: Two times a day (BID) | ORAL | 0 refills | Status: DC

## 2019-08-02 MED ORDER — TRAMADOL HCL 50 MG PO TABS: 50.0000 mg | ORAL_TABLET | Freq: Three times a day (TID) | ORAL | 0 refills | Status: AC | PRN

## 2019-08-02 NOTE — Progress Notes (Signed)
HPI: Matthew Barton is a 68 y.o. male who  has a past medical history of Diabetes mellitus without complication ([place]), Hyperlipidemia, Hypertension, and Stroke ([place]).  he presents to [facility] today, 08/02/19,  for chief complaint of:  DM2, HTN  Chronically poorly controlled HTN and DM2 Medication access has been a huge barrier Patient still using 70/30 insulin 60 units bid BP not at goal Cholesterol not at goal Hugely high risk, hx CVA  Pt has an appointment later this week w/ an insurance specialit      At today's visit 08/02/19 ... PMH, PSH, FH reviewed and updated as needed.  Current medication list and allergy/intolerance hx reviewed and updated as needed. (See remainder of HPI, ROS, Phys Exam below)   Recent Results (from the past 2160 hour(s))  CBC     Status: Abnormal   Collection Time: 07/28/19 10:40 AM  Result Value Ref Range   WBC 9.0 3.8 - 10.8 Thousand/uL   RBC 5.13 4.20 - 5.80 Million/uL   Hemoglobin 13.2 13.2 - 17.1 g/dL   HCT 40.2 38.5 - 50.0 %   MCV 78.4 (L) 80.0 - 100.0 fL   MCH 25.7 (L) 27.0 - 33.0 pg   MCHC 32.8 32.0 - 36.0 g/dL   RDW 13.6 11.0 - 15.0 %   Platelets 429 (H) 140 - 400 Thousand/uL   MPV 10.4 7.5 - 12.5 fL  COMPLETE METABOLIC PANEL WITH GFR     Status: Abnormal   Collection Time: 07/28/19 10:40 AM  Result Value Ref Range   Glucose, Bld 337 (H) 65 - 99 mg/dL    Comment: .            Fasting reference interval . For someone without known diabetes, a glucose value >125 mg/dL indicates that they may have diabetes and this should be confirmed with a follow-up test. .    BUN 21 7 - 25 mg/dL   Creat 1.24 0.70 - 1.25 mg/dL    Comment: For patients >37 years of age, the reference limit for Creatinine is approximately 13% higher for people identified as African-American. .    GFR, Est Non African American 59 (L) > OR = 60 mL/min/1.36m   GFR, Est African American 69 > OR = 60 mL/min/1.757m   BUN/Creatinine Ratio NOT APPLICABLE 6 - 22 (calc)   Sodium 134 (L) 135 - 146 mmol/L   Potassium 4.3 3.5 - 5.3 mmol/L   Chloride 97 (L) 98 - 110 mmol/L   CO2 26 20 - 32 mmol/L   Calcium 9.2 8.6 - 10.3 mg/dL   Total Protein 7.5 6.1 - 8.1 g/dL   Albumin 4.3 3.6 - 5.1 g/dL   Globulin 3.2 1.9 - 3.7 g/dL (calc)   AG Ratio 1.3 1.0 - 2.5 (calc)   Total Bilirubin 0.4 0.2 - 1.2 mg/dL   Alkaline phosphatase (APISO) 55 35 - 144 U/L   AST 13 10 - 35 U/L   ALT 12 9 - 46 U/L  Lipid panel     Status: Abnormal   Collection Time: 07/28/19 10:40 AM  Result Value Ref Range   Cholesterol 197 <200 mg/dL   HDL 27 (L) > OR = 40 mg/dL   Triglycerides 318 (H) <150 mg/dL    Comment: . If a non-fasting specimen was collected, consider repeat triglyceride testing on a fasting specimen if clinically indicated.  JaYates Decampt al. J. of Clin. Lipidol. 201308;6:578-469. Marland Kitchen  LDL Cholesterol (Calc) 125 (H)  mg/dL (calc)    Comment: Reference range: <100 . Desirable range <100 mg/dL for primary prevention;   <70 mg/dL for patients with CHD or diabetic patients  with > or = 2 CHD risk factors. Marland Kitchen LDL-C is now calculated using the Martin-Hopkins  calculation, which is a validated novel method providing  better accuracy than the Friedewald equation in the  estimation of LDL-C.  Cresenciano Genre et al. Annamaria Helling. 7096;283(66): 2061-2068  (http://education.[facility].com/faq/FAQ164)    Total CHOL/HDL Ratio 7.3 (H) <5.0 (calc)   Non-HDL Cholesterol (Calc) 170 (H) <130 mg/dL (calc)    Comment: For patients with diabetes plus 1 major ASCVD risk  factor, treating to a non-HDL-C goal of <100 mg/dL  (LDL-C of <70 mg/dL) is considered a therapeutic  option.   Hemoglobin A1c     Status: Abnormal   Collection Time: 07/28/19 10:40 AM  Result Value Ref Range   Hgb A1c MFr Bld 10.7 (H) <5.7 % of total Hgb    Comment: For someone without known diabetes, a hemoglobin A1c value of 6.5% or greater indicates that they may have   diabetes and this should be confirmed with a follow-up  test. . For someone with known diabetes, a value <7% indicates  that their diabetes is well controlled and a value  greater than or equal to 7% indicates suboptimal  control. A1c targets should be individualized based on  duration of diabetes, age, comorbid conditions, and  other considerations. . Currently, no consensus exists regarding use of hemoglobin A1c for diagnosis of diabetes for children. .    Mean Plasma Glucose 260 (calc)   eAG (mmol/L) 14.4 (calc)  PSA, Total with Reflex to PSA, Free     Status: None   Collection Time: 07/28/19 10:40 AM  Result Value Ref Range   PSA, Total 1.1 < OR = 4.0 ng/mL    Comment: The Total PSA value from this assay system is  standardized against the equimolar PSA standard.  The test result will be approximately 20% higher  when compared to the [facility] Total PSA  (Siemens assay). Comparison of serial PSA results  should be interpreted with this fact in mind. Marland Kitchen PSA was performed using the Beckman Coulter  Immunoassay method. Values obtained from different  assay methods cannot be used interchangeably. PSA  levels, regardless of value, should not be  interpreted as absolute evidence of the presence or  absence of disease.           ASSESSMENT/PLAN: The primary encounter diagnosis was Uncontrolled hypertension. Diagnoses of Uncontrolled type 2 diabetes mellitus with hyperglycemia ([place]), Morbid obesity ([place]), and Hyperlipidemia associated with type 2 diabetes mellitus ([place]) were also pertinent to this visit.   No orders of the defined types were placed in this encounter.    Meds ordered this encounter  Medications  . furosemide (LASIX) 40 MG tablet    Sig: Take 1 tablet (40 mg total) by mouth 2 (two) times daily.    Dispense:  180 tablet    Refill:  0  . traMADol (ULTRAM) 50 MG tablet    Sig: Take 1 tablet (50 mg total) by mouth every 8 (eight) hours as needed  for severe pain.    Dispense:  30 tablet    Refill:  0  . cyclobenzaprine (FLEXERIL) 10 MG tablet    Sig: Take 0.5-1 tablets (5-10 mg total) by mouth 3 (three) times daily as needed.    Dispense:  90 tablet    Refill:  1  Patient Instructions  Plan:  My goal is to get you on a more effective insulin regimen.  For now, can continue the 7030 but I would increase by 2 units in the morning and 1 unit in the evening until fasting sugars are at least around 150-200.  Let's increase the Lasix to 40 mg twice per day.  I sent in a higher dose pill for this but you can use the 20 mg tablets until these are out.  To ask the insurance company folks:  Need to get better insulin/diabetes care coverage, .  Ideally would like Toujeo long acting insulin or similar, Ozempic SGLT-2 or similar, Jardiance or [place].   Need to get weight loss medications covered: Qsymia, Contrave, Saxenda  Need to get PCSK-9 medications Repatha or similar           Follow-up plan: Return for Doximity visit in 1 week, follow-up on blood pressure, discuss insurance .                                                 ################################################# ################################################# ################################################# #################################################    Current Meds  Medication Sig  . acetaminophen (TYLENOL) 500 MG tablet Take 1,000 mg by mouth every 6 (six) hours as needed.  . Alcohol Swabs (ALCOHOL PREP) PADS Use up to four times a day DX:E11.9  . amLODipine (NORVASC) 10 MG tablet Take 1 tablet (10 mg total) by mouth daily.  . Blood Glucose Calibration (ADVOCATE CONTROL SOLUTION) High LIQD Use with glucometer to check blood sugar up to four times daily as directed. ICD 10 Dx: E11.59.  . blood glucose meter kit and supplies KIT Dispense based on patient and insurance preference. Use up to four times  daily as directed. ICD 10 Dx: E11.59. Please dispense lancets x100 refill 99, test strips x100 refill 99, control solution x1 refill 99, and sharps container  . clopidogrel (PLAVIX) 75 MG tablet TAKE 1 TABLET BY MOUTH ONCE DAILY . APPOINTMENT REQUIRED FOR FUTURE REFILLS  . Continuous Blood Gluc Receiver (FREESTYLE LIBRE 14 DAY READER) DEVI 1 each by Does not apply route every 14 (fourteen) days. Dx E11.65  . Continuous Blood Gluc Sensor (FREESTYLE LIBRE 14 DAY SENSOR) MISC APPLY 1 SENSOR ONCE EVERY 14 DAYS.  Marland Kitchen cyclobenzaprine (FLEXERIL) 10 MG tablet Take 0.5-1 tablets (5-10 mg total) by mouth 3 (three) times daily as needed.  . furosemide (LASIX) 40 MG tablet Take 1 tablet (40 mg total) by mouth 2 (two) times daily.  Marland Kitchen glucose blood test strip Accucheck. Use 4 times daily to check blood sugar as directed with glucometer. Dx:E11.65  . hydrochlorothiazide (HYDRODIURIL) 25 MG tablet TAKE 1 TABLET BY MOUTH ONCE DAILY . APPOINTMENT REQUIRED FOR FUTURE REFILLS  . Insulin Glargine (TOUJEO SOLOSTAR) 300 UNIT/ML SOPN Inject 80-90 Units into the skin at bedtime.  . Insulin Pen Needle (BD PEN NEEDLE NANO U/F) 32G X 4 MM MISC Use to check blood sugar up to four times daily as directed. ICD 10 Dx: E11.59.  Marland Kitchen insulin regular (NOVOLIN R,HUMULIN R) 100 units/mL injection Inject 0.1 mLs (10 Units total) into the skin 3 (three) times daily before meals.  Elmore Guise Device MISC Use up to 4 times per day DX: E11.9  . Lancets Misc. (ACCU-CHEK FASTCLIX LANCET) KIT Use to check blood sugar up to four times daily as directed. ICD 10 Dx:  E11.59.  Marland Kitchen loratadine (CLARITIN) 10 MG tablet Take by mouth.  . lovastatin (MEVACOR) 10 MG tablet TAKE 1 TABLET BY MOUTH AT BEDTIME  . Multiple Vitamins-Minerals (MULTIVITAMIN WITH MINERALS) tablet Take by mouth.  . naproxen (NAPROSYN) 500 MG tablet Take 1 tablet (500 mg total) by mouth 2 (two) times daily with a meal. As needed for pain  . polyethylene glycol powder (GLYCOLAX/MIRALAX) powder  Take by mouth.  . traMADol (ULTRAM) 50 MG tablet Take 1 tablet (50 mg total) by mouth every 8 (eight) hours as needed for severe pain.  . traZODone (DESYREL) 50 MG tablet Take 0.5-2 tablets (25-100 mg total) by mouth at bedtime as needed for sleep.  . valsartan (DIOVAN) 320 MG tablet Take 1 tablet by mouth once daily  . [DISCONTINUED] cyclobenzaprine (FLEXERIL) 10 MG tablet Take 0.5-1 tablets (5-10 mg total) by mouth 3 (three) times daily as needed.  . [DISCONTINUED] furosemide (LASIX) 20 MG tablet TAKE 1 TABLET BY MOUTH TWICE DAILY . APPOINTMENT REQUIRED FOR FUTURE REFILLS  . [DISCONTINUED] traMADol (ULTRAM) 50 MG tablet Take 1 tablet (50 mg total) by mouth every 8 (eight) hours as needed for severe pain.    Allergies  Allergen Reactions  . Statins Other (See Comments)    Myalgia at higher doses/more potent meds  . Beta Adrenergic Blockers     Severe fatigue   . Metformin And Related Diarrhea       Review of Systems:  Constitutional: No recent illness  Cardiac: No  chest pain, No  pressure, No palpitations  Respiratory:  No  shortness of breath. No  Cough  Musculoskeletal: No new myalgia/arthralgia  Neurologic: No  weakness, No  Dizziness  Psychiatric: No  concerns with depression, No  concerns with anxiety  Exam:  BP (!) 181/84 (BP Location: Right Arm, Patient Position: Sitting, Cuff Size: Large)   Pulse 82   Temp 98 F (36.7 C) (Oral)   Wt (!) 344 lb 1.9 oz (156.1 kg)   BMI 47.99 kg/m   Constitutional: VS see above. General Appearance: alert, well-developed, well-nourished, NAD  Neck: No masses, trachea midline.   Respiratory: Normal respiratory effort. no wheeze, no rhonchi, no rales  Cardiovascular: S1/S2 normal, no murmur, no rub/gallop auscultated. RRR.   Musculoskeletal: Gait normal. Symmetric and independent movement of all extremities  Neurological: Normal balance/coordination. No tremor.  Skin: warm, dry, intact.   Psychiatric: Normal  judgment/insight. Normal mood and affect. Oriented x3.       Visit summary with medication list and pertinent instructions was printed for patient to review, patient was advised to alert [place] if any updates are needed. All questions at time of visit were answered - patient instructed to contact office with any additional concerns. ER/RTC precautions were reviewed with the patient and understanding verbalized.   Note: Total time spent 25 minutes, greater than 50% of the visit was spent face-to-face counseling and coordinating care for the following: The primary encounter diagnosis was Uncontrolled hypertension. Diagnoses of Uncontrolled type 2 diabetes mellitus with hyperglycemia ([place]), Morbid obesity ([place]), and Hyperlipidemia associated with type 2 diabetes mellitus ([place]) were also pertinent to this visit.Marland Kitchen  Please note: voice recognition software was used to produce this document, and typos may escape review. Please contact Dr. Sheppard Coil for any needed clarifications.    Follow up plan: Return for Doximity visit in 1 week, follow-up on blood pressure, discuss insurance .

## 2019-08-02 NOTE — Patient Instructions (Addendum)
Plan:  My goal is to get you on a more effective insulin regimen.  For now, can continue the 7030 but I would increase by 2 units in the morning and 1 unit in the evening until fasting sugars are at least around 150-200.  Let's increase the Lasix to 40 mg twice per day.  I sent in a higher dose pill for this but you can use the 20 mg tablets until these are out.  To ask the insurance company folks:  Need to get better insulin/diabetes care coverage, .  Ideally would like Toujeo long acting insulin or similar, Ozempic SGLT-2 or similar, Jardiance or [place].   Need to get weight loss medications covered: Qsymia, Contrave, Saxenda  Need to get PCSK-9 medications Repatha or similar

## 2019-08-10 ENCOUNTER — Other Ambulatory Visit: Payer: Self-pay | Admitting: Osteopathic Medicine

## 2019-08-10 DIAGNOSIS — I10 Essential (primary) hypertension: Secondary | ICD-10-CM

## 2019-08-17 ENCOUNTER — Telehealth: Payer: Self-pay | Admitting: Osteopathic Medicine

## 2019-08-17 ENCOUNTER — Encounter: Payer: Self-pay | Admitting: Osteopathic Medicine

## 2019-08-17 ENCOUNTER — Ambulatory Visit (INDEPENDENT_AMBULATORY_CARE_PROVIDER_SITE_OTHER): Payer: Medicare Other | Admitting: Osteopathic Medicine

## 2019-08-17 VITALS — Wt 370.0 lb

## 2019-08-17 DIAGNOSIS — Z8673 Personal history of transient ischemic attack (TIA), and cerebral infarction without residual deficits: Secondary | ICD-10-CM

## 2019-08-17 DIAGNOSIS — I10 Essential (primary) hypertension: Secondary | ICD-10-CM | POA: Diagnosis not present

## 2019-08-17 DIAGNOSIS — E1165 Type 2 diabetes mellitus with hyperglycemia: Secondary | ICD-10-CM

## 2019-08-17 NOTE — Patient Instructions (Signed)
We will work on getting some paperwork together for patient assistance for some of the medications that I think would help reduce your risk of complications from diabetes, high cholesterol, and high blood pressure.  I will have a nurse assemble this paperwork for you and for Hinton Dyer and will leave this up front for you.  You come to get the paperwork, please get blood work done.  Does not have to be fasting.  We are following up on kidney function after increasing Lasix medications.  And depending on blood pressure, we may need to further adjust medicines.  If you are unable to find your blood pressure monitor, please come to the office for a nurse visit for blood pressure check.  Please call ahead so that they can put you on the schedule so nurse will be available.  Assuming blood pressure is looking better and labs checked out fine, we will plan to see you again in 3 months to recheck A1c!

## 2019-08-17 NOTE — Progress Notes (Signed)
Virtual Visit via Video (App used: Doximity) Note  I connected with      Matthew Barton on 08/17/19 at 10:35 AM by a telemedicine application and verified that I am speaking with the correct person using two identifiers.  Patient is at home I am in office    I discussed the limitations of evaluation and management by telemedicine and the availability of in person appointments. The patient expressed understanding and agreed to proceed.  History of Present Illness: Matthew Barton is a 68 y.o. male who would like to discuss  Chief Complaint  Patient presents with  . Diabetes  . Results   BP: BP Readings from Last 3 Encounters:  08/02/19 (!) 181/84  11/14/18 (!) 161/69  08/15/18 (!) 165/75   Chronically poorly controlled HTN and DM2 Medication access has been a huge barrier  Patient still using 70/30 insulin, was at 60 units bid - last visit I advised For now, can continue the 70/30 but I would increase by 2 units in the morning and 1 unit in the evening until fasting sugars are at least around 150-200.  He has not made any adjustments to the 70/30 insulin.  BP not at goal!  Patient has misplaced home blood pressure monitor and is not sure what pressure is since adjusting the medications. Current meds: Amlodipine 10  Lasix 40 bid HCT 25 mg daily  Valsartan 320 mg daily   We increased the Lasix to 40 mg bid.   Cholesterol not at goal. Ideally would like him on a PCSK9.   Hugely high risk, hx CVA   Pt had an appointment last week w/ an Gaffer. I'd advised to ask about medications.  He said that they really did not offer any kind of plan which would change his access to prescriptions/make things more affordable.        Observations/Objective: Wt (!) 370 lb (167.8 kg)   BMI 51.60 kg/m  BP Readings from Last 3 Encounters:  08/02/19 (!) 181/84  11/14/18 (!) 161/69  08/15/18 (!) 165/75   Exam: Normal Speech.  NAD  Lab and Radiology Results No  results found for this or any previous visit (from the past 72 hour(s)). No results found.     Assessment and Plan: 68 y.o. male with The primary encounter diagnosis was Uncontrolled type 2 diabetes mellitus with hyperglycemia ([place]). Diagnoses of History of CVA (cerebrovascular accident) and Uncontrolled hypertension were also pertinent to this visit.  Advised patient to go up to 65 or 70 units of the 70/30 insulin, in the mornings.  Can keep evening dose at 60 units the same.  We will see if we can work on getting some patient assistance forms for [employer] [place] from [employer], [employer] from [employer], [employer] from [employer].   PDMP not reviewed this encounter. Orders Placed This Encounter  Procedures  . BASIC METABOLIC PANEL WITH GFR   No orders of the defined types were placed in this encounter.  Patient Instructions  We will work on getting some paperwork together for patient assistance for some of the medications that I think would help reduce your risk of complications from diabetes, high cholesterol, and high blood pressure.  I will have a nurse assemble this paperwork for you and for Hinton Dyer and will leave this up front for you.  You come to get the paperwork, please get blood work done.  Does not have to be fasting.  We are following up on kidney function  after increasing Lasix medications.  And depending on blood pressure, we may need to further adjust medicines.  If you are unable to find your blood pressure monitor, please come to the office for a nurse visit for blood pressure check.  Please call ahead so that they can put you on the schedule so nurse will be available.  Assuming blood pressure is looking better and labs checked out fine, we will plan to see you again in 3 months to recheck A1c!     Instructions sent via MyChart. If MyChart not available, pt was given option for info via personal e-mail w/ no guarantee of protected health info over unsecured e-mail  communication, and MyChart sign-up instructions were included.   Follow Up Instructions: No follow-ups on file.    I discussed the assessment and treatment plan with the patient. The patient was provided an opportunity to ask questions and all were answered. The patient agreed with the plan and demonstrated an understanding of the instructions.   The patient was advised to call back or seek an in-person evaluation if any new concerns, if symptoms worsen or if the condition fails to improve as anticipated.  25 minutes of non-face-to-face time was provided during this encounter.                      Historical information moved to improve visibility of documentation.  Past Medical History:  Diagnosis Date  . Diabetes mellitus without complication ([place])   . Hyperlipidemia   . Hypertension   . Stroke [facility])    No past surgical history on file. Social History   Tobacco Use  . Smoking status: Never Smoker  . Smokeless tobacco: Never Used  Substance Use Topics  . Alcohol use: No   family history is not on file.  Medications: Current Outpatient Medications  Medication Sig Dispense Refill  . acetaminophen (TYLENOL) 500 MG tablet Take 1,000 mg by mouth every 6 (six) hours as needed.    . Alcohol Swabs (ALCOHOL PREP) PADS Use up to four times a day DX:E11.9 400 each 6  . amLODipine (NORVASC) 10 MG tablet Take 1 tablet by mouth once daily 30 tablet 0  . Blood Glucose Calibration (ADVOCATE CONTROL SOLUTION) High LIQD Use with glucometer to check blood sugar up to four times daily as directed. ICD 10 Dx: E11.59. 1 each 99  . blood glucose meter kit and supplies KIT Dispense based on patient and insurance preference. Use up to four times daily as directed. ICD 10 Dx: E11.59. Please dispense lancets x100 refill 99, test strips x100 refill 99, control solution x1 refill 99, and sharps container 1 each 0  . clopidogrel (PLAVIX) 75 MG tablet TAKE 1 TABLET BY MOUTH ONCE DAILY .  APPOINTMENT REQUIRED FOR FUTURE REFILLS 30 tablet 0  . Continuous Blood Gluc Receiver (FREESTYLE LIBRE 14 DAY READER) DEVI 1 each by Does not apply route every 14 (fourteen) days. Dx E11.65 1 Device 3  . Continuous Blood Gluc Sensor (FREESTYLE LIBRE 14 DAY SENSOR) MISC APPLY 1 SENSOR ONCE EVERY 14 DAYS. 3 each prn  . cyclobenzaprine (FLEXERIL) 10 MG tablet Take 0.5-1 tablets (5-10 mg total) by mouth 3 (three) times daily as needed. 90 tablet 1  . furosemide (LASIX) 40 MG tablet Take 1 tablet (40 mg total) by mouth 2 (two) times daily. 180 tablet 0  . glucose blood test strip Accucheck. Use 4 times daily to check blood sugar as directed with glucometer. Dx:E11.65 100 each  12  . hydrochlorothiazide (HYDRODIURIL) 25 MG tablet TAKE 1 TABLET BY MOUTH ONCE DAILY . APPOINTMENT REQUIRED FOR FUTURE REFILLS 30 tablet 0  . Insulin Glargine (TOUJEO SOLOSTAR) 300 UNIT/ML SOPN Inject 80-90 Units into the skin at bedtime. 4.5 mL 11  . Insulin Pen Needle (BD PEN NEEDLE NANO U/F) 32G X 4 MM MISC Use to check blood sugar up to four times daily as directed. ICD 10 Dx: E11.59. 100 each 4  . insulin regular (NOVOLIN R,HUMULIN R) 100 units/mL injection Inject 0.1 mLs (10 Units total) into the skin 3 (three) times daily before meals. 10 mL 11  . Lancet Device MISC Use up to 4 times per day DX: E11.9 400 each 6  . Lancets Misc. (ACCU-CHEK FASTCLIX LANCET) KIT Use to check blood sugar up to four times daily as directed. ICD 10 Dx: E11.59. 1 kit 4  . loratadine (CLARITIN) 10 MG tablet Take by mouth.    . lovastatin (MEVACOR) 10 MG tablet TAKE 1 TABLET BY MOUTH AT BEDTIME 90 tablet 3  . Multiple Vitamins-Minerals (MULTIVITAMIN WITH MINERALS) tablet Take by mouth.    . naproxen (NAPROSYN) 500 MG tablet Take 1 tablet (500 mg total) by mouth 2 (two) times daily with a meal. As needed for pain 60 tablet 3  . polyethylene glycol powder (GLYCOLAX/MIRALAX) powder Take by mouth.    . traMADol (ULTRAM) 50 MG tablet Take 1 tablet (50  mg total) by mouth every 8 (eight) hours as needed for severe pain. 30 tablet 0  . traZODone (DESYREL) 50 MG tablet Take 0.5-2 tablets (25-100 mg total) by mouth at bedtime as needed for sleep. 60 tablet 0  . valsartan (DIOVAN) 320 MG tablet Take 1 tablet by mouth once daily 90 tablet 0  . LORazepam (ATIVAN) 0.5 MG tablet 1-2 tabs 30 - 60 min prior to MRI. Do not drive with this medicine. (Patient not taking: Reported on 08/17/2019) 4 tablet 0   No current facility-administered medications for this visit.    Allergies  Allergen Reactions  . Statins Other (See Comments)    Myalgia at higher doses/more potent meds  . Beta Adrenergic Blockers     Severe fatigue   . Metformin And Related Diarrhea    PDMP not reviewed this encounter. Orders Placed This Encounter  Procedures  . BASIC METABOLIC PANEL WITH GFR   No orders of the defined types were placed in this encounter.

## 2019-08-17 NOTE — Telephone Encounter (Signed)
Left message for patient to call back about forms for patient assistance.

## 2019-08-17 NOTE — Telephone Encounter (Signed)
Hey! Can we print some forms for patient assistance for the following drug manufacturers, and set these aside for him to come pick up?  [employer] (for [employer] and probably Art gallery manager - both insulins) [employer] (for [employer])  Forensic psychologist (for [employer])   Thanks!

## 2019-08-20 ENCOUNTER — Other Ambulatory Visit: Payer: Self-pay | Admitting: Osteopathic Medicine

## 2019-08-23 ENCOUNTER — Encounter: Payer: Self-pay | Admitting: Osteopathic Medicine

## 2019-08-25 NOTE — Telephone Encounter (Signed)
I called and patient wife answered. I let her know I was placing some paperwork for the patient at the front desk. Front staff will be aware.

## 2019-08-27 ENCOUNTER — Encounter: Payer: Self-pay | Admitting: Osteopathic Medicine

## 2019-08-28 NOTE — Telephone Encounter (Signed)
VM not set up yet, sent pt message via mychart.  Thanks.

## 2019-08-29 LAB — BASIC METABOLIC PANEL WITH GFR
BUN/Creatinine Ratio: 17 (calc) (ref 6–22)
BUN: 30 mg/dL — ABNORMAL HIGH (ref 7–25)
CO2: 31 mmol/L (ref 20–32)
Calcium: 9.9 mg/dL (ref 8.6–10.3)
Chloride: 94 mmol/L — ABNORMAL LOW (ref 98–110)
Creat: 1.78 mg/dL — ABNORMAL HIGH (ref 0.70–1.25)
GFR, Est African American: 44 mL/min/{1.73_m2} — ABNORMAL LOW (ref >=60)
GFR, Est Non African American: 38 mL/min/{1.73_m2} — ABNORMAL LOW (ref >=60)
Glucose, Bld: 366 mg/dL — ABNORMAL HIGH (ref 65–99)
Potassium: 4.4 mmol/L (ref 3.5–5.3)
Sodium: 134 mmol/L — ABNORMAL LOW (ref 135–146)

## 2019-08-31 ENCOUNTER — Encounter: Payer: Self-pay | Admitting: Osteopathic Medicine

## 2019-08-31 MED ORDER — HYDRALAZINE HCL 25 MG PO TABS: 25.0000 mg | ORAL_TABLET | Freq: Three times a day (TID) | ORAL | 0 refills | Status: DC

## 2019-09-01 ENCOUNTER — Encounter: Payer: Self-pay | Admitting: Osteopathic Medicine

## 2019-09-09 ENCOUNTER — Other Ambulatory Visit: Payer: Self-pay | Admitting: Osteopathic Medicine

## 2019-09-09 DIAGNOSIS — I10 Essential (primary) hypertension: Secondary | ICD-10-CM

## 2019-09-09 NOTE — Telephone Encounter (Signed)
Forwarding medication refill request to the clinical pool for review. 

## 2019-09-10 ENCOUNTER — Encounter: Payer: Self-pay | Admitting: Osteopathic Medicine

## 2019-09-11 ENCOUNTER — Other Ambulatory Visit: Payer: Self-pay | Admitting: Osteopathic Medicine

## 2019-09-16 ENCOUNTER — Other Ambulatory Visit: Payer: Self-pay | Admitting: Osteopathic Medicine

## 2019-09-16 DIAGNOSIS — I10 Essential (primary) hypertension: Secondary | ICD-10-CM

## 2019-09-16 NOTE — Telephone Encounter (Signed)
Requested medication (s) are due for refill today: yes  Requested medication (s) are on the active medication list: yes  Last refill:  08/21/2019  Future visit scheduled: yes  Notes to clinic:  Review for refill   Requested Prescriptions  Pending Prescriptions Disp Refills   clopidogrel (PLAVIX) 75 MG tablet [Pharmacy Med Name: Clopidogrel Bisulfate 75 MG Oral Tablet] 30 tablet 0    Sig: TAKE 1 TABLET BY MOUTH ONCE DAILY . APPOINTMENT REQUIRED FOR FUTURE REFILLS     Hematology: Antiplatelets - clopidogrel Failed - 09/16/2019  9:11 AM      Failed - Evaluate AST, ALT within 2 months of therapy initiation.      Failed - PLT in normal range and within 180 days    Platelets  Date Value Ref Range Status  07/28/2019 429 (H) 140 - 400 Thousand/uL Final         Failed - Valid encounter within last 6 months    Recent Outpatient Visits          1 month ago Uncontrolled type 2 diabetes mellitus with hyperglycemia [facility])   [place] [facility], Dorene Grebe, DO   1 month ago Uncontrolled hypertension   [place] [facility], Dorene Grebe, DO   2 months ago Uncontrolled type 2 diabetes mellitus with hyperglycemia [facility])   [place] [facility], Dorene Grebe, DO   10 months ago Uncontrolled type 2 diabetes mellitus with hyperglycemia [facility])   [place] [facility], Dorene Grebe, DO   1 year ago Uncontrolled type 2 diabetes mellitus with hyperglycemia [facility])   [place] [facility], Dorene Grebe, DO      Future Appointments            In 5 days Matthew Nielsen, DO  [facility] - ALT in normal range and within 360 days    ALT  Date Value Ref Range Status  07/28/2019 12 9 - 46 U/L Final         Passed - AST in normal range and within 360 days    AST  Date Value Ref Range Status   07/28/2019 13 10 - 35 U/L Final         Passed - HCT in normal range and within 180 days    HCT  Date Value Ref Range Status  07/28/2019 40.2 38.5 - 50.0 % Final         Passed - HGB in normal range and within 180 days    Hemoglobin  Date Value Ref Range Status  07/28/2019 13.2 13.2 - 17.1 g/dL Final          hydrochlorothiazide (HYDRODIURIL) 25 MG tablet [Pharmacy Med Name: hydroCHLOROthiazide 25 MG Oral Tablet] 30 tablet 0    Sig: TAKE 1 TABLET BY MOUTH ONCE DAILY . APPOINTMENT REQUIRED FOR FUTURE REFILLS     Cardiovascular: Diuretics - Thiazide Failed - 09/16/2019  9:11 AM      Failed - Cr in normal range and within 360 days    Creat  Date Value Ref Range Status  08/28/2019 1.78 (H) 0.70 - 1.25 mg/dL Final    Comment:    For patients >71 years of age, the reference limit for Creatinine is approximately 13% higher for people identified as African-American. Marland Kitchen  Failed - Na in normal range and within 360 days    Sodium  Date Value Ref Range Status  08/28/2019 134 (L) 135 - 146 mmol/L Final         Failed - Last BP in normal range    BP Readings from Last 1 Encounters:  08/02/19 (!) 181/84         Failed - Valid encounter within last 6 months    Recent Outpatient Visits          1 month ago Uncontrolled type 2 diabetes mellitus with hyperglycemia [facility])   [place] [facility], Lanelle Bal, DO   1 month ago Uncontrolled hypertension   [place] [facility], Lanelle Bal, DO   2 months ago Uncontrolled type 2 diabetes mellitus with hyperglycemia [facility])   [place] [facility], Lanelle Bal, DO   10 months ago Uncontrolled type 2 diabetes mellitus with hyperglycemia [facility])   [place] [facility], Lanelle Bal, DO   1 year ago Uncontrolled type 2 diabetes mellitus with hyperglycemia [facility])   [place] [facility], Lanelle Bal, DO      Future Appointments            In 5 days Matthew Reeve, DO  [facility] At [place] in normal range and within 360 days    Calcium  Date Value Ref Range Status  08/28/2019 9.9 8.6 - 10.3 mg/dL Final         Passed - K in normal range and within 360 days    Potassium  Date Value Ref Range Status  08/28/2019 4.4 3.5 - 5.3 mmol/L Final

## 2019-09-19 ENCOUNTER — Other Ambulatory Visit: Payer: Self-pay | Admitting: Osteopathic Medicine

## 2019-09-19 NOTE — Telephone Encounter (Signed)
Requested medication (s) are due for refill today:  yes  Requested medication (s) are on the active medication list: yes  Last refill:  08/21/2019  Future visit scheduled: no  Notes to clinic:  Review for refill   Requested Prescriptions  Pending Prescriptions Disp Refills   clopidogrel (PLAVIX) 75 MG tablet [Pharmacy Med Name: Clopidogrel Bisulfate 75 MG Oral Tablet] 30 tablet 0    Sig: TAKE 1 TABLET BY MOUTH ONCE DAILY . APPOINTMENT REQUIRED FOR FUTURE REFILLS     Hematology: Antiplatelets - clopidogrel Failed - 09/19/2019  9:03 AM      Failed - Evaluate AST, ALT within 2 months of therapy initiation.      Failed - PLT in normal range and within 180 days    Platelets  Date Value Ref Range Status  07/28/2019 429 (H) 140 - 400 Thousand/uL Final         Failed - Valid encounter within last 6 months    Recent Outpatient Visits          1 month ago Uncontrolled type 2 diabetes mellitus with hyperglycemia [facility])   [place] [facility], Lanelle Bal, DO   1 month ago Uncontrolled hypertension   [place] [facility], Lanelle Bal, DO   2 months ago Uncontrolled type 2 diabetes mellitus with hyperglycemia [facility])   [place] [facility], Lanelle Bal, DO   10 months ago Uncontrolled type 2 diabetes mellitus with hyperglycemia [facility])   [place] [facility], Lanelle Bal, DO   1 year ago Uncontrolled type 2 diabetes mellitus with hyperglycemia [facility])   [place] [facility], Lanelle Bal, DO      Future Appointments            In 2 days Emeterio Reeve, DO [place] [facility] - ALT in normal range and within 360 days    ALT  Date Value Ref Range Status  07/28/2019 12 9 - 46 U/L Final         Passed - AST in normal range and within 360 days    AST  Date Value Ref Range Status   07/28/2019 13 10 - 35 U/L Final         Passed - HCT in normal range and within 180 days    HCT  Date Value Ref Range Status  07/28/2019 40.2 38.5 - 50.0 % Final         Passed - HGB in normal range and within 180 days    Hemoglobin  Date Value Ref Range Status  07/28/2019 13.2 13.2 - 17.1 g/dL Final

## 2019-09-21 ENCOUNTER — Encounter: Payer: Self-pay | Admitting: Osteopathic Medicine

## 2019-09-21 ENCOUNTER — Ambulatory Visit (INDEPENDENT_AMBULATORY_CARE_PROVIDER_SITE_OTHER): Payer: Medicare Other | Admitting: Osteopathic Medicine

## 2019-09-21 VITALS — BP 150/80 | Wt 325.0 lb

## 2019-09-21 DIAGNOSIS — Z8673 Personal history of transient ischemic attack (TIA), and cerebral infarction without residual deficits: Secondary | ICD-10-CM | POA: Diagnosis not present

## 2019-09-21 DIAGNOSIS — N179 Acute kidney failure, unspecified: Secondary | ICD-10-CM

## 2019-09-21 DIAGNOSIS — I10 Essential (primary) hypertension: Secondary | ICD-10-CM | POA: Diagnosis not present

## 2019-09-21 DIAGNOSIS — E11649 Type 2 diabetes mellitus with hypoglycemia without coma: Secondary | ICD-10-CM

## 2019-09-21 MED ORDER — DAPAGLIFLOZIN PROPANEDIOL 5 MG PO TABS: 5.0000 mg | ORAL_TABLET | Freq: Every day | ORAL | 3 refills | Status: AC

## 2019-09-21 MED ORDER — CLONIDINE HCL 0.1 MG PO TABS: 0.1000 mg | ORAL_TABLET | Freq: Two times a day (BID) | ORAL | 0 refills | Status: DC

## 2019-09-21 NOTE — Progress Notes (Signed)
Virtual Visit via Video (App used: Doximity) Note  I connected with      Matthew Barton on 09/21/19 at 9:40 AM  by a telemedicine application and verified that I am speaking with the correct person using two identifiers.  Patient is at home I am in office   I discussed the limitations of evaluation and management by telemedicine and the availability of in person appointments. The patient expressed understanding and agreed to proceed.  History of Present Illness: Matthew Barton is a 68 y.o. male who would like to discuss diabetes, blood pressure   Hydralazine caused heart racing and diarrhea so he stopped this. Spironolactone caused dizziness, stopped this.  70/30 insulin, 60-70 units bid, he's noticing some low Glc into 60 this AM   BP Readings from Last 3 Encounters:  09/21/19 (!) 150/80  08/02/19 (!) 181/84  11/14/18 (!) 161/69   Wt Readings from Last 3 Encounters:  09/21/19 (!) 325 lb (147.4 kg)  08/17/19 (!) 370 lb (167.8 kg) - error?   08/02/19 (!) 344 lb 1.9 oz (156.1 kg)      Observations/Objective: BP (!) 150/80   Wt (!) 325 lb (147.4 kg)   BMI 45.33 kg/m  BP Readings from Last 3 Encounters:  09/21/19 (!) 150/80  08/02/19 (!) 181/84  11/14/18 (!) 161/69   Exam: Normal Speech.  NAD  Lab and Radiology Results No results found for this or any previous visit (from the past 72 hour(s)). No results found.     Assessment and Plan: 68 y.o. male with The primary encounter diagnosis was Uncontrolled type 2 diabetes mellitus with hypoglycemia without coma ([place]). Diagnoses of History of CVA (cerebrovascular accident), Uncontrolled hypertension, and AKI (acute kidney injury) ([place]) were also pertinent to this visit.   PDMP not reviewed this encounter. Orders Placed This Encounter  Procedures  . BASIC METABOLIC PANEL WITH GFR   Meds ordered this encounter  Medications  . cloNIDine (CATAPRES) 0.1 MG tablet    Sig: Take 1 tablet (0.1 mg total) by mouth 2  (two) times daily.    Dispense:  60 tablet    Refill:  0  . dapagliflozin propanediol (FARXIGA) 5 MG TABS tablet    Sig: Take 5 mg by mouth daily.    Dispense:  90 tablet    Refill:  3   Patient Instructions  Since kidney function was a little low at last check, I'd like to recheck this BEFORE we would start Farxiga, please come to lab at your convenience and wait to hear back from me about your results before you pick up the Farxiga medications.   Let's try clonidine for blood pressure, start at lower dose 0.1 mg twice daily.   Please decrease the evening dose of the 70/30 insulin by 10-20 units, and can increase morning dose by 10-20 units if feeling ok. This will hopefully help avoid low blood sugars in the morning  Keep up diet changes! See below!      DASH Eating Plan DASH stands for "Dietary Approaches to Stop Hypertension." The DASH eating plan is a healthy eating plan that has been shown to reduce high blood pressure (hypertension). It may also reduce your risk for type 2 diabetes, heart disease, and stroke. The DASH eating plan may also help with weight loss. What are tips for following this plan?  General guidelines  Avoid eating more than 2,300 mg (milligrams) of salt (sodium) a day. If you have hypertension, you may need to  reduce your sodium intake to 1,500 mg a day.  Limit alcohol intake to no more than 1 drink a day for nonpregnant women and 2 drinks a day for men. One drink equals 12 oz of beer, 5 oz of wine, or 1 oz of hard liquor.  Work with your health care provider to maintain a healthy body weight or to lose weight. Ask what an ideal weight is for you.  Get at least 30 minutes of exercise that causes your heart to beat faster (aerobic exercise) most days of the week. Activities may include walking, swimming, or biking.  Work with your health care provider or diet and nutrition specialist (dietitian) to adjust your eating plan to your individual calorie needs.  Reading food labels   Check food labels for the amount of sodium per serving. Choose foods with less than 5 percent of the Daily Value of sodium. Generally, foods with less than 300 mg of sodium per serving fit into this eating plan.  To find whole grains, look for the word "whole" as the first word in the ingredient list. Shopping  Buy products labeled as "low-sodium" or "no salt added."  Buy fresh foods. Avoid canned foods and premade or frozen meals. Cooking  Avoid adding salt when cooking. Use salt-free seasonings or herbs instead of table salt or sea salt. Check with your health care provider or pharmacist before using salt substitutes.  Do not fry foods. Cook foods using healthy methods such as baking, boiling, grilling, and broiling instead.  Cook with heart-healthy oils, such as olive, canola, soybean, or sunflower oil. Meal planning  Eat a balanced diet that includes: ? 5 or more servings of fruits and vegetables each day. At each meal, try to fill half of your plate with fruits and vegetables. ? Up to 6-8 servings of whole grains each day. ? Less than 6 oz of lean meat, poultry, or fish each day. A 3-oz serving of meat is about the same size as a deck of cards. One egg equals 1 oz. ? 2 servings of low-fat dairy each day. ? A serving of nuts, seeds, or beans 5 times each week. ? Heart-healthy fats. Healthy fats called Omega-3 fatty acids are found in foods such as flaxseeds and coldwater fish, like sardines, salmon, and mackerel.  Limit how much you eat of the following: ? Canned or prepackaged foods. ? Food that is high in trans fat, such as fried foods. ? Food that is high in saturated fat, such as fatty meat. ? Sweets, desserts, sugary drinks, and other foods with added sugar. ? Full-fat dairy products.  Do not salt foods before eating.  Try to eat at least 2 vegetarian meals each week.  Eat more home-cooked food and less restaurant, buffet, and fast food.  When  eating at a restaurant, ask that your food be prepared with less salt or no salt, if possible. What foods are recommended? The items listed may not be a complete list. Talk with your dietitian about what dietary choices are best for you. Grains Whole-grain or whole-wheat bread. Whole-grain or whole-wheat pasta. Brown rice. [place]. Bulgur. Whole-grain and low-sodium cereals. Pita bread. Low-fat, low-sodium crackers. Whole-wheat flour tortillas. Vegetables Fresh or frozen vegetables (raw, steamed, roasted, or grilled). Low-sodium or reduced-sodium tomato and vegetable juice. Low-sodium or reduced-sodium tomato sauce and tomato paste. Low-sodium or reduced-sodium canned vegetables. Fruits All fresh, dried, or frozen fruit. Canned fruit in natural juice (without added sugar). Meat and other protein foods Skinless  chicken or [place]. Ground chicken or [place]. Pork with fat trimmed off. Fish and seafood. Egg whites. Dried beans, peas, or lentils. Unsalted nuts, nut butters, and seeds. Unsalted canned beans. Lean cuts of beef with fat trimmed off. Low-sodium, lean deli meat. Dairy Low-fat (1%) or fat-free (skim) milk. Fat-free, low-fat, or reduced-fat cheeses. Nonfat, low-sodium ricotta or cottage cheese. Low-fat or nonfat yogurt. Low-fat, low-sodium cheese. Fats and oils Soft margarine without trans fats. Vegetable oil. Low-fat, reduced-fat, or light mayonnaise and salad dressings (reduced-sodium). Canola, safflower, olive, soybean, and sunflower oils. Avocado. Seasoning and other foods Herbs. Spices. Seasoning mixes without salt. Unsalted popcorn and pretzels. Fat-free sweets. What foods are not recommended? The items listed may not be a complete list. Talk with your dietitian about what dietary choices are best for you. Grains Baked goods made with fat, such as croissants, muffins, or some breads. Dry pasta or rice meal packs. Vegetables Creamed or fried vegetables. Vegetables in a cheese  sauce. Regular canned vegetables (not low-sodium or reduced-sodium). Regular canned tomato sauce and paste (not low-sodium or reduced-sodium). Regular tomato and vegetable juice (not low-sodium or reduced-sodium). Angie Fava. Olives. Fruits Canned fruit in a light or heavy syrup. Fried fruit. Fruit in cream or butter sauce. Meat and other protein foods Fatty cuts of meat. Ribs. Fried meat. Berniece Salines. Sausage. Bologna and other processed lunch meats. Salami. Fatback. Hotdogs. Bratwurst. Salted nuts and seeds. Canned beans with added salt. Canned or smoked fish. Whole eggs or egg yolks. Chicken or [place] with skin. Dairy Whole or 2% milk, cream, and half-and-half. Whole or full-fat cream cheese. Whole-fat or sweetened yogurt. Full-fat cheese. Nondairy creamers. Whipped toppings. Processed cheese and cheese spreads. Fats and oils Butter. Stick margarine. Lard. Shortening. Ghee. Bacon fat. Tropical oils, such as coconut, palm kernel, or palm oil. Seasoning and other foods Salted popcorn and pretzels. Onion salt, garlic salt, seasoned salt, table salt, and sea salt. Worcestershire sauce. Tartar sauce. Barbecue sauce. Teriyaki sauce. Soy sauce, including reduced-sodium. Steak sauce. Canned and packaged gravies. Fish sauce. Oyster sauce. Cocktail sauce. Horseradish that you find on the shelf. Ketchup. Mustard. Meat flavorings and tenderizers. Bouillon cubes. Hot sauce and Tabasco sauce. Premade or packaged marinades. Premade or packaged taco seasonings. Relishes. Regular salad dressings. Where to find more information:  National Heart, Lung, and [place]: https://wilson-eaton.com/  American Heart Association: www.heart.org Summary  The DASH eating plan is a healthy eating plan that has been shown to reduce high blood pressure (hypertension). It may also reduce your risk for type 2 diabetes, heart disease, and stroke.  With the DASH eating plan, you should limit salt (sodium) intake to 2,300 mg a day. If you  have hypertension, you may need to reduce your sodium intake to 1,500 mg a day.  When on the DASH eating plan, aim to eat more fresh fruits and vegetables, whole grains, lean proteins, low-fat dairy, and heart-healthy fats.  Work with your health care provider or diet and nutrition specialist (dietitian) to adjust your eating plan to your individual calorie needs. This information is not intended to replace advice given to you by your health care provider. Make sure you discuss any questions you have with your health care provider. Document Released: 09/17/2011 Document Revised: 09/10/2017 Document Reviewed: 09/21/2016 Elsevier Patient Education  2020 [place] refers to food and lifestyle choices that are based on the traditions of countries located on the [employer]. This way of eating has been shown to help  prevent certain conditions and improve outcomes for people who have chronic diseases, like kidney disease and heart disease. What are tips for following this plan? Lifestyle  Cook and eat meals together with your family, when possible.  Drink enough fluid to keep your urine clear or pale yellow.  Be physically active every day. This includes: ? Aerobic exercise like running or swimming. ? Leisure activities like gardening, walking, or housework.  Get 7-8 hours of sleep each night.  If recommended by your health care provider, drink red wine in moderation. This means 1 glass a day for nonpregnant women and 2 glasses a day for men. A glass of wine equals 5 oz (150 mL). Reading food labels   Check the serving size of packaged foods. For foods such as rice and pasta, the serving size refers to the amount of cooked product, not dry.  Check the total fat in packaged foods. Avoid foods that have saturated fat or trans fats.  Check the ingredients list for added sugars, such as corn syrup. Shopping  At the grocery store, buy  most of your food from the areas near the walls of the store. This includes: ? Fresh fruits and vegetables (produce). ? Grains, beans, nuts, and seeds. Some of these may be available in unpackaged forms or large amounts (in bulk). ? Fresh seafood. ? Poultry and eggs. ? Low-fat dairy products.  Buy whole ingredients instead of prepackaged foods.  Buy fresh fruits and vegetables in-season from local farmers markets.  Buy frozen fruits and vegetables in resealable bags.  If you do not have access to quality fresh seafood, buy precooked frozen shrimp or canned fish, such as tuna, salmon, or sardines.  Buy small amounts of raw or cooked vegetables, salads, or olives from the deli or salad bar at your store.  Stock your pantry so you always have certain foods on hand, such as olive oil, canned tuna, canned tomatoes, rice, pasta, and beans. Cooking  Cook foods with extra-virgin olive oil instead of using butter or other vegetable oils.  Have meat as a side dish, and have vegetables or grains as your main dish. This means having meat in small portions or adding small amounts of meat to foods like pasta or stew.  Use beans or vegetables instead of meat in common dishes like chili or lasagna.  Experiment with different cooking methods. Try roasting or broiling vegetables instead of steaming or sauteing them.  Add frozen vegetables to soups, stews, pasta, or rice.  Add nuts or seeds for added healthy fat at each meal. You can add these to yogurt, salads, or vegetable dishes.  Marinate fish or vegetables using olive oil, lemon juice, garlic, and fresh herbs. Meal planning   Plan to eat 1 vegetarian meal one day each week. Try to work up to 2 vegetarian meals, if possible.  Eat seafood 2 or more times a week.  Have healthy snacks readily available, such as: ? Vegetable sticks with hummus. ? [place] yogurt. ? Fruit and nut trail mix.  Eat balanced meals throughout the week. This  includes: ? Fruit: 2-3 servings a day ? Vegetables: 4-5 servings a day ? Low-fat dairy: 2 servings a day ? Fish, poultry, or lean meat: 1 serving a day ? Beans and legumes: 2 or more servings a week ? Nuts and seeds: 1-2 servings a day ? Whole grains: 6-8 servings a day ? Extra-virgin olive oil: 3-4 servings a day  Limit red meat and sweets to only a  few servings a month What are my food choices?  Mediterranean diet ? Recommended  Grains: Whole-grain pasta. Brown rice. Bulgar wheat. Polenta. Couscous. Whole-wheat bread. [place].  Vegetables: Artichokes. Beets. Broccoli. Cabbage. Carrots. Eggplant. Green beans. Chard. Kale. Spinach. Onions. Leeks. Peas. Squash. Tomatoes. Peppers. Radishes.  Fruits: Apples. Apricots. Avocado. Berries. Bananas. Cherries. Dates. Figs. Grapes. Lemons. Melon. Oranges. Peaches. Plums. Pomegranate.  Meats and other protein foods: Beans. Almonds. Sunflower seeds. Pine nuts. Peanuts. [place]. Salmon. Scallops. Shrimp. [place]. Tilapia. Clams. Oysters. Eggs.  Dairy: Low-fat milk. Cheese. Greek yogurt.  Beverages: Water. Red wine. Herbal tea.  Fats and oils: Extra virgin olive oil. Avocado oil. Grape seed oil.  Sweets and desserts: [place] yogurt with honey. Baked apples. Poached pears. Trail mix.  Seasoning and other foods: Basil. Cilantro. Coriander. Cumin. Mint. Parsley. Sage. Rosemary. Tarragon. Garlic. Oregano. Thyme. Pepper. Balsalmic vinegar. Tahini. Hummus. Tomato sauce. Olives. Mushrooms. ? Limit these  Grains: Prepackaged pasta or rice dishes. Prepackaged cereal with added sugar.  Vegetables: Deep fried potatoes (french fries).  Fruits: Fruit canned in syrup.  Meats and other protein foods: Beef. Pork. Lamb. Poultry with skin. Hot dogs. Berniece Salines.  Dairy: Ice cream. Sour cream. Whole milk.  Beverages: Juice. Sugar-sweetened soft drinks. Beer. Liquor and spirits.  Fats and oils: Butter. Canola oil. Vegetable oil. Beef fat (tallow). Lard.   Sweets and desserts: Cookies. Cakes. Pies. Candy.  Seasoning and other foods: Mayonnaise. Premade sauces and marinades. The items listed may not be a complete list. Talk with your dietitian about what dietary choices are right for you. Summary  The Mediterranean diet includes both food and lifestyle choices.  Eat a variety of fresh fruits and vegetables, beans, nuts, seeds, and whole grains.  Limit the amount of red meat and sweets that you eat.  Talk with your health care provider about whether it is safe for you to drink red wine in moderation. This means 1 glass a day for nonpregnant women and 2 glasses a day for men. A glass of wine equals 5 oz (150 mL). This information is not intended to replace advice given to you by your health care provider. Make sure you discuss any questions you have with your health care provider. Document Released: 05/21/2016 Document Revised: 05/28/2016 Document Reviewed: 05/21/2016 Elsevier Patient Education  [place].    Instructions sent via [place]. If MyChart not available, pt was given option for info via personal e-mail w/ no guarantee of protected health info over unsecured e-mail communication, and MyChart sign-up instructions were sent to patient.   Follow Up Instructions: Return for A1C recheck due .    I discussed the assessment and treatment plan with the patient. The patient was provided an opportunity to ask questions and all were answered. The patient agreed with the plan and demonstrated an understanding of the instructions.   The patient was advised to call back or seek an in-person evaluation if any new concerns, if symptoms worsen or if the condition fails to improve as anticipated.  25 minutes of non-face-to-face time was provided during this encounter.      . . . . . . . . . . . . . Marland Kitchen                   Historical information moved to improve visibility of documentation.  Past Medical  History:  Diagnosis Date  . Diabetes mellitus without complication ([place])   . Hyperlipidemia   . Hypertension   . Stroke [facility])  No past surgical history on file. Social History   Tobacco Use  . Smoking status: Never Smoker  . Smokeless tobacco: Never Used  Substance Use Topics  . Alcohol use: No   family history is not on file.  Medications: Current Outpatient Medications  Medication Sig Dispense Refill  . acetaminophen (TYLENOL) 500 MG tablet Take 1,000 mg by mouth every 6 (six) hours as needed.    . Alcohol Swabs (ALCOHOL PREP) PADS Use up to four times a day DX:E11.9 400 each 6  . amLODipine (NORVASC) 10 MG tablet Take 1 tablet by mouth once daily 30 tablet 0  . Blood Glucose Calibration (ADVOCATE CONTROL SOLUTION) High LIQD Use with glucometer to check blood sugar up to four times daily as directed. ICD 10 Dx: E11.59. 1 each 99  . blood glucose meter kit and supplies KIT Dispense based on patient and insurance preference. Use up to four times daily as directed. ICD 10 Dx: E11.59. Please dispense lancets x100 refill 99, test strips x100 refill 99, control solution x1 refill 99, and sharps container 1 each 0  . clopidogrel (PLAVIX) 75 MG tablet TAKE 1 TABLET BY MOUTH ONCE DAILY . APPOINTMENT REQUIRED FOR FUTURE REFILLS 15 tablet 0  . Continuous Blood Gluc Receiver (FREESTYLE LIBRE 14 DAY READER) DEVI 1 each by Does not apply route every 14 (fourteen) days. Dx E11.65 1 Device 3  . Continuous Blood Gluc Sensor (FREESTYLE LIBRE 14 DAY SENSOR) MISC APPLY 1 SENSOR ONCE EVERY 14 DAYS. 3 each prn  . cyclobenzaprine (FLEXERIL) 10 MG tablet Take 0.5-1 tablets (5-10 mg total) by mouth 3 (three) times daily as needed. 90 tablet 1  . furosemide (LASIX) 40 MG tablet Take 1 tablet (40 mg total) by mouth 2 (two) times daily. 180 tablet 0  . glucose blood test strip Accucheck. Use 4 times daily to check blood sugar as directed with glucometer. Dx:E11.65 100 each 12  . hydrochlorothiazide  (HYDRODIURIL) 25 MG tablet TAKE 1 TABLET BY MOUTH ONCE DAILY . APPOINTMENT REQUIRED FOR FUTURE REFILLS 15 tablet 0  . Insulin Glargine (TOUJEO SOLOSTAR) 300 UNIT/ML SOPN Inject 80-90 Units into the skin at bedtime. 4.5 mL 11  . Insulin Pen Needle (BD PEN NEEDLE NANO U/F) 32G X 4 MM MISC Use to check blood sugar up to four times daily as directed. ICD 10 Dx: E11.59. 100 each 4  . insulin regular (NOVOLIN R,HUMULIN R) 100 units/mL injection Inject 0.1 mLs (10 Units total) into the skin 3 (three) times daily before meals. 10 mL 11  . Lancet Device MISC Use up to 4 times per day DX: E11.9 400 each 6  . Lancets Misc. (ACCU-CHEK FASTCLIX LANCET) KIT Use to check blood sugar up to four times daily as directed. ICD 10 Dx: E11.59. 1 kit 4  . loratadine (CLARITIN) 10 MG tablet Take by mouth.    . lovastatin (MEVACOR) 10 MG tablet TAKE 1 TABLET BY MOUTH AT BEDTIME 90 tablet 3  . Multiple Vitamins-Minerals (MULTIVITAMIN WITH MINERALS) tablet Take by mouth.    . naproxen (NAPROSYN) 500 MG tablet Take 1 tablet (500 mg total) by mouth 2 (two) times daily with a meal. As needed for pain 60 tablet 3  . polyethylene glycol powder (GLYCOLAX/MIRALAX) powder Take by mouth.    . traMADol (ULTRAM) 50 MG tablet Take 1 tablet (50 mg total) by mouth every 8 (eight) hours as needed for severe pain. 30 tablet 0  . traZODone (DESYREL) 50 MG tablet Take 0.5-2 tablets (25-100  mg total) by mouth at bedtime as needed for sleep. 60 tablet 0  . valsartan (DIOVAN) 320 MG tablet Take 1 tablet by mouth once daily 90 tablet 1  . hydrALAZINE (APRESOLINE) 25 MG tablet Take 1 tablet (25 mg total) by mouth 3 (three) times daily. (Patient not taking: Reported on 09/21/2019) 90 tablet 0  . LORazepam (ATIVAN) 0.5 MG tablet 1-2 tabs 30 - 60 min prior to MRI. Do not drive with this medicine. (Patient not taking: Reported on 08/17/2019) 4 tablet 0   No current facility-administered medications for this visit.   Allergies  Allergen Reactions  .  Statins Other (See Comments)    Myalgia at higher doses/more potent meds  . Beta Adrenergic Blockers     Severe fatigue   . Hydralazine Palpitations  . Metformin And Related Diarrhea

## 2019-09-21 NOTE — Patient Instructions (Signed)
Since kidney function was a little low at last check, I'd like to recheck this BEFORE we would start Farxiga, please come to lab at your convenience and wait to hear back from me about your results before you pick up the Farxiga medications.   Let's try clonidine for blood pressure, start at lower dose 0.1 mg twice daily.   Please decrease the evening dose of the 70/30 insulin by 10-20 units, and can increase morning dose by 10-20 units if feeling ok. This will hopefully help avoid low blood sugars in the morning  Keep up diet changes! See below!      DASH Eating Plan DASH stands for "Dietary Approaches to Stop Hypertension." The DASH eating plan is a healthy eating plan that has been shown to reduce high blood pressure (hypertension). It may also reduce your risk for type 2 diabetes, heart disease, and stroke. The DASH eating plan may also help with weight loss. What are tips for following this plan?  General guidelines  Avoid eating more than 2,300 mg (milligrams) of salt (sodium) a day. If you have hypertension, you may need to reduce your sodium intake to 1,500 mg a day.  Limit alcohol intake to no more than 1 drink a day for nonpregnant women and 2 drinks a day for men. One drink equals 12 oz of beer, 5 oz of wine, or 1 oz of hard liquor.  Work with your health care provider to maintain a healthy body weight or to lose weight. Ask what an ideal weight is for you.  Get at least 30 minutes of exercise that causes your heart to beat faster (aerobic exercise) most days of the week. Activities may include walking, swimming, or biking.  Work with your health care provider or diet and nutrition specialist (dietitian) to adjust your eating plan to your individual calorie needs. Reading food labels   Check food labels for the amount of sodium per serving. Choose foods with less than 5 percent of the Daily Value of sodium. Generally, foods with less than 300 mg of sodium per serving fit into  this eating plan.  To find whole grains, look for the word "whole" as the first word in the ingredient list. Shopping  Buy products labeled as "low-sodium" or "no salt added."  Buy fresh foods. Avoid canned foods and premade or frozen meals. Cooking  Avoid adding salt when cooking. Use salt-free seasonings or herbs instead of table salt or sea salt. Check with your health care provider or pharmacist before using salt substitutes.  Do not fry foods. Cook foods using healthy methods such as baking, boiling, grilling, and broiling instead.  Cook with heart-healthy oils, such as olive, canola, soybean, or sunflower oil. Meal planning  Eat a balanced diet that includes: ? 5 or more servings of fruits and vegetables each day. At each meal, try to fill half of your plate with fruits and vegetables. ? Up to 6-8 servings of whole grains each day. ? Less than 6 oz of lean meat, poultry, or fish each day. A 3-oz serving of meat is about the same size as a deck of cards. One egg equals 1 oz. ? 2 servings of low-fat dairy each day. ? A serving of nuts, seeds, or beans 5 times each week. ? Heart-healthy fats. Healthy fats called Omega-3 fatty acids are found in foods such as flaxseeds and coldwater fish, like sardines, salmon, and mackerel.  Limit how much you eat of the following: ? Canned or prepackaged  foods. ? Food that is high in trans fat, such as fried foods. ? Food that is high in saturated fat, such as fatty meat. ? Sweets, desserts, sugary drinks, and other foods with added sugar. ? Full-fat dairy products.  Do not salt foods before eating.  Try to eat at least 2 vegetarian meals each week.  Eat more home-cooked food and less restaurant, buffet, and fast food.  When eating at a restaurant, ask that your food be prepared with less salt or no salt, if possible. What foods are recommended? The items listed may not be a complete list. Talk with your dietitian about what dietary  choices are best for you. Grains Whole-grain or whole-wheat bread. Whole-grain or whole-wheat pasta. Brown rice. Orpah Cobb. Bulgur. Whole-grain and low-sodium cereals. Pita bread. Low-fat, low-sodium crackers. Whole-wheat flour tortillas. Vegetables Fresh or frozen vegetables (raw, steamed, roasted, or grilled). Low-sodium or reduced-sodium tomato and vegetable juice. Low-sodium or reduced-sodium tomato sauce and tomato paste. Low-sodium or reduced-sodium canned vegetables. Fruits All fresh, dried, or frozen fruit. Canned fruit in natural juice (without added sugar). Meat and other protein foods Skinless chicken or [place]. Ground chicken or [place]. Pork with fat trimmed off. Fish and seafood. Egg whites. Dried beans, peas, or lentils. Unsalted nuts, nut butters, and seeds. Unsalted canned beans. Lean cuts of beef with fat trimmed off. Low-sodium, lean deli meat. Dairy Low-fat (1%) or fat-free (skim) milk. Fat-free, low-fat, or reduced-fat cheeses. Nonfat, low-sodium ricotta or cottage cheese. Low-fat or nonfat yogurt. Low-fat, low-sodium cheese. Fats and oils Soft margarine without trans fats. Vegetable oil. Low-fat, reduced-fat, or light mayonnaise and salad dressings (reduced-sodium). Canola, safflower, olive, soybean, and sunflower oils. Avocado. Seasoning and other foods Herbs. Spices. Seasoning mixes without salt. Unsalted popcorn and pretzels. Fat-free sweets. What foods are not recommended? The items listed may not be a complete list. Talk with your dietitian about what dietary choices are best for you. Grains Baked goods made with fat, such as croissants, muffins, or some breads. Dry pasta or rice meal packs. Vegetables Creamed or fried vegetables. Vegetables in a cheese sauce. Regular canned vegetables (not low-sodium or reduced-sodium). Regular canned tomato sauce and paste (not low-sodium or reduced-sodium). Regular tomato and vegetable juice (not low-sodium or reduced-sodium).  Rosita Fire. Olives. Fruits Canned fruit in a light or heavy syrup. Fried fruit. Fruit in cream or butter sauce. Meat and other protein foods Fatty cuts of meat. Ribs. Fried meat. Tomasa Blase. Sausage. Bologna and other processed lunch meats. Salami. Fatback. Hotdogs. Bratwurst. Salted nuts and seeds. Canned beans with added salt. Canned or smoked fish. Whole eggs or egg yolks. Chicken or [place] with skin. Dairy Whole or 2% milk, cream, and half-and-half. Whole or full-fat cream cheese. Whole-fat or sweetened yogurt. Full-fat cheese. Nondairy creamers. Whipped toppings. Processed cheese and cheese spreads. Fats and oils Butter. Stick margarine. Lard. Shortening. Ghee. Bacon fat. Tropical oils, such as coconut, palm kernel, or palm oil. Seasoning and other foods Salted popcorn and pretzels. Onion salt, garlic salt, seasoned salt, table salt, and sea salt. Worcestershire sauce. Tartar sauce. Barbecue sauce. Teriyaki sauce. Soy sauce, including reduced-sodium. Steak sauce. Canned and packaged gravies. Fish sauce. Oyster sauce. Cocktail sauce. Horseradish that you find on the shelf. Ketchup. Mustard. Meat flavorings and tenderizers. Bouillon cubes. Hot sauce and Tabasco sauce. Premade or packaged marinades. Premade or packaged taco seasonings. Relishes. Regular salad dressings. Where to find more information:  National Heart, Lung, and Blood Institute: PopSteam.is  American Heart Association: www.heart.org Summary  The DASH eating  plan is a healthy eating plan that has been shown to reduce high blood pressure (hypertension). It may also reduce your risk for type 2 diabetes, heart disease, and stroke.  With the DASH eating plan, you should limit salt (sodium) intake to 2,300 mg a day. If you have hypertension, you may need to reduce your sodium intake to 1,500 mg a day.  When on the DASH eating plan, aim to eat more fresh fruits and vegetables, whole grains, lean proteins, low-fat dairy, and  heart-healthy fats.  Work with your health care provider or diet and nutrition specialist (dietitian) to adjust your eating plan to your individual calorie needs. This information is not intended to replace advice given to you by your health care provider. Make sure you discuss any questions you have with your health care provider. Document Released: 09/17/2011 Document Revised: 09/10/2017 Document Reviewed: 09/21/2016 Elsevier Patient Education  2020 [employer].    Mediterranean Diet A Mediterranean diet refers to food and lifestyle choices that are based on the traditions of countries located on the [employer] [place]. This way of eating has been shown to help prevent certain conditions and improve outcomes for people who have chronic diseases, like kidney disease and heart disease. What are tips for following this plan? Lifestyle  Cook and eat meals together with your family, when possible.  Drink enough fluid to keep your urine clear or pale yellow.  Be physically active every day. This includes: ? Aerobic exercise like running or swimming. ? Leisure activities like gardening, walking, or housework.  Get 7-8 hours of sleep each night.  If recommended by your health care provider, drink red wine in moderation. This means 1 glass a day for nonpregnant women and 2 glasses a day for men. A glass of wine equals 5 oz (150 mL). Reading food labels   Check the serving size of packaged foods. For foods such as rice and pasta, the serving size refers to the amount of cooked product, not dry.  Check the total fat in packaged foods. Avoid foods that have saturated fat or trans fats.  Check the ingredients list for added sugars, such as corn syrup. Shopping  At the grocery store, buy most of your food from the areas near the walls of the store. This includes: ? Fresh fruits and vegetables (produce). ? Grains, beans, nuts, and seeds. Some of these may be available in unpackaged forms or  large amounts (in bulk). ? Fresh seafood. ? Poultry and eggs. ? Low-fat dairy products.  Buy whole ingredients instead of prepackaged foods.  Buy fresh fruits and vegetables in-season from local farmers markets.  Buy frozen fruits and vegetables in resealable bags.  If you do not have access to quality fresh seafood, buy precooked frozen shrimp or canned fish, such as tuna, salmon, or sardines.  Buy small amounts of raw or cooked vegetables, salads, or olives from the deli or salad bar at your store.  Stock your pantry so you always have certain foods on hand, such as olive oil, canned tuna, canned tomatoes, rice, pasta, and beans. Cooking  Cook foods with extra-virgin olive oil instead of using butter or other vegetable oils.  Have meat as a side dish, and have vegetables or grains as your main dish. This means having meat in small portions or adding small amounts of meat to foods like pasta or stew.  Use beans or vegetables instead of meat in common dishes like chili or lasagna.  Experiment with different cooking methods.  Try roasting or broiling vegetables instead of steaming or sauteing them.  Add frozen vegetables to soups, stews, pasta, or rice.  Add nuts or seeds for added healthy fat at each meal. You can add these to yogurt, salads, or vegetable dishes.  Marinate fish or vegetables using olive oil, lemon juice, garlic, and fresh herbs. Meal planning   Plan to eat 1 vegetarian meal one day each week. Try to work up to 2 vegetarian meals, if possible.  Eat seafood 2 or more times a week.  Have healthy snacks readily available, such as: ? Vegetable sticks with hummus. ? [place] yogurt. ? Fruit and nut trail mix.  Eat balanced meals throughout the week. This includes: ? Fruit: 2-3 servings a day ? Vegetables: 4-5 servings a day ? Low-fat dairy: 2 servings a day ? Fish, poultry, or lean meat: 1 serving a day ? Beans and legumes: 2 or more servings a week ? Nuts and  seeds: 1-2 servings a day ? Whole grains: 6-8 servings a day ? Extra-virgin olive oil: 3-4 servings a day  Limit red meat and sweets to only a few servings a month What are my food choices?  Mediterranean diet ? Recommended  Grains: Whole-grain pasta. Brown rice. Bulgar wheat. Polenta. Couscous. Whole-wheat bread. Orpah Cobbatmeal. Quinoa.  Vegetables: Artichokes. Beets. Broccoli. Cabbage. Carrots. Eggplant. Green beans. Chard. Kale. Spinach. Onions. Leeks. Peas. Squash. Tomatoes. Peppers. Radishes.  Fruits: Apples. Apricots. Avocado. Berries. Bananas. Cherries. Dates. Figs. Grapes. Lemons. Melon. Oranges. Peaches. Plums. Pomegranate.  Meats and other protein foods: Beans. Almonds. Sunflower seeds. Pine nuts. Peanuts. Cod. Salmon. Scallops. Shrimp. Tuna. Tilapia. Clams. Oysters. Eggs.  Dairy: Low-fat milk. Cheese. Greek yogurt.  Beverages: Water. Red wine. Herbal tea.  Fats and oils: Extra virgin olive oil. Avocado oil. Grape seed oil.  Sweets and desserts: [place] yogurt with honey. Baked apples. Poached pears. Trail mix.  Seasoning and other foods: Basil. Cilantro. Coriander. Cumin. Mint. Parsley. Sage. Rosemary. Tarragon. Garlic. Oregano. Thyme. Pepper. Balsalmic vinegar. Tahini. Hummus. Tomato sauce. Olives. Mushrooms. ? Limit these  Grains: Prepackaged pasta or rice dishes. Prepackaged cereal with added sugar.  Vegetables: Deep fried potatoes (french fries).  Fruits: Fruit canned in syrup.  Meats and other protein foods: Beef. Pork. Lamb. Poultry with skin. Hot dogs. Tomasa BlaseBacon.  Dairy: Ice cream. Sour cream. Whole milk.  Beverages: Juice. Sugar-sweetened soft drinks. Beer. Liquor and spirits.  Fats and oils: Butter. Canola oil. Vegetable oil. Beef fat (tallow). Lard.  Sweets and desserts: Cookies. Cakes. Pies. Candy.  Seasoning and other foods: Mayonnaise. Premade sauces and marinades. The items listed may not be a complete list. Talk with your dietitian about what dietary choices  are right for you. Summary  The Mediterranean diet includes both food and lifestyle choices.  Eat a variety of fresh fruits and vegetables, beans, nuts, seeds, and whole grains.  Limit the amount of red meat and sweets that you eat.  Talk with your health care provider about whether it is safe for you to drink red wine in moderation. This means 1 glass a day for nonpregnant women and 2 glasses a day for men. A glass of wine equals 5 oz (150 mL). This information is not intended to replace advice given to you by your health care provider. Make sure you discuss any questions you have with your health care provider. Document Released: 05/21/2016 Document Revised: 05/28/2016 Document Reviewed: 05/21/2016 Elsevier Patient Education  2020 [employer]

## 2019-09-27 LAB — BASIC METABOLIC PANEL WITH GFR
BUN/Creatinine Ratio: 19 (calc) (ref 6–22)
BUN: 26 mg/dL — ABNORMAL HIGH (ref 7–25)
CO2: 31 mmol/L (ref 20–32)
Calcium: 10.1 mg/dL (ref 8.6–10.3)
Chloride: 97 mmol/L — ABNORMAL LOW (ref 98–110)
Creat: 1.4 mg/dL — ABNORMAL HIGH (ref 0.70–1.25)
GFR, Est African American: 59 mL/min/{1.73_m2} — ABNORMAL LOW (ref >=60)
GFR, Est Non African American: 51 mL/min/{1.73_m2} — ABNORMAL LOW (ref >=60)
Glucose, Bld: 119 mg/dL (ref 65–139)
Potassium: 4.5 mmol/L (ref 3.5–5.3)
Sodium: 136 mmol/L (ref 135–146)

## 2019-09-28 ENCOUNTER — Encounter: Payer: Self-pay | Admitting: Osteopathic Medicine

## 2019-09-30 ENCOUNTER — Other Ambulatory Visit: Payer: Self-pay | Admitting: Osteopathic Medicine

## 2019-09-30 DIAGNOSIS — I10 Essential (primary) hypertension: Secondary | ICD-10-CM

## 2019-10-01 NOTE — Telephone Encounter (Signed)
Requested medication (s) are due for refill today: yes  Requested medication (s) are on the active medication list: yes  Last refill:  09/19/2019  Future visit scheduled:no  Notes to clinic:  review for refill   Requested Prescriptions  Pending Prescriptions Disp Refills   hydrochlorothiazide (HYDRODIURIL) 25 MG tablet [Pharmacy Med Name: hydroCHLOROthiazide 25 MG Oral Tablet] 15 tablet 0    Sig: TAKE 1 TABLET BY MOUTH ONCE DAILY . APPOINTMENT REQUIRED FOR FUTURE REFILLS      Cardiovascular: Diuretics - Thiazide Failed - 09/30/2019  8:08 PM      Failed - Cr in normal range and within 360 days    Creat  Date Value Ref Range Status  09/26/2019 1.40 (H) 0.70 - 1.25 mg/dL Final    Comment:    For patients >38 years of age, the reference limit for Creatinine is approximately 13% higher for people identified as African-American. .    Creatinine, POC  Date Value Ref Range Status  07/30/2017 50 mg/dL Final          Failed - Last BP in normal range    BP Readings from Last 1 Encounters:  09/21/19 (!) 150/80          Passed - Ca in normal range and within 360 days    Calcium  Date Value Ref Range Status  09/26/2019 10.1 8.6 - 10.3 mg/dL Final          Passed - K in normal range and within 360 days    Potassium  Date Value Ref Range Status  09/26/2019 4.5 3.5 - 5.3 mmol/L Final          Passed - Na in normal range and within 360 days    Sodium  Date Value Ref Range Status  09/26/2019 136 135 - 146 mmol/L Final          Passed - Valid encounter within last 6 months    Recent Outpatient Visits           1 week ago Uncontrolled type 2 diabetes mellitus with hypoglycemia without coma [facility])   [place] [facility], Lanelle Bal, DO   1 month ago Uncontrolled type 2 diabetes mellitus with hyperglycemia [facility])   [place] [facility], Lanelle Bal, DO   2 months ago Uncontrolled hypertension   [place]  [facility], Lanelle Bal, DO   3 months ago Uncontrolled type 2 diabetes mellitus with hyperglycemia [facility])   [place] [facility], Lanelle Bal, DO   10 months ago Uncontrolled type 2 diabetes mellitus with hyperglycemia [facility])   [place], [place], DO

## 2019-10-07 ENCOUNTER — Other Ambulatory Visit: Payer: Self-pay | Admitting: Osteopathic Medicine

## 2019-10-07 DIAGNOSIS — I10 Essential (primary) hypertension: Secondary | ICD-10-CM

## 2019-10-07 NOTE — Telephone Encounter (Signed)
Forwarding medication refill requests to the clinical pool for review. 

## 2019-10-10 LAB — HM DIABETES EYE EXAM

## 2019-10-11 ENCOUNTER — Other Ambulatory Visit: Payer: Self-pay | Admitting: Osteopathic Medicine

## 2019-10-11 DIAGNOSIS — I10 Essential (primary) hypertension: Secondary | ICD-10-CM

## 2019-10-12 ENCOUNTER — Encounter: Payer: Self-pay | Admitting: Osteopathic Medicine

## 2019-10-12 DIAGNOSIS — I10 Essential (primary) hypertension: Secondary | ICD-10-CM

## 2019-10-12 NOTE — Telephone Encounter (Signed)
Please see patient's note.   Amlodipine was refilled today. Sheppard Coil out of office next week. Please advise on next steps.. monitor at home and update via MyChart or visit?   Last seen by Dr Sheppard Coil 09/21/19 and recommendations ere as follows:   "Since kidney function was a little low at last check, I'd like to recheck this BEFORE we would start Farxiga, please come to lab at your convenience and wait to hear back from me about your results before you pick up the Farxiga medications.   Let's try clonidine for blood pressure, start at lower dose 0.1 mg twice daily.   Please decrease the evening dose of the 70/30 insulin by 10-20 units, and can increase morning dose by 10-20 units if feeling ok. This will hopefully help avoid low blood sugars in the morning  Keep up diet changes! See below! "  Lab work was done and pt was advised to "continue current medications"

## 2019-10-12 NOTE — Telephone Encounter (Signed)
Call pt: let him know sleep can effect BP. Get him in next week to discuss insomnia.   It is normal for blood sugar to increase over night with no food or drink. I would cut back to 10 units at bedtime of insulin. Same dose morning insulin. Keep monitor readings and present at follow up.

## 2019-10-20 ENCOUNTER — Encounter: Payer: Self-pay | Admitting: Osteopathic Medicine

## 2019-10-22 ENCOUNTER — Other Ambulatory Visit: Payer: Self-pay | Admitting: Osteopathic Medicine

## 2019-10-24 ENCOUNTER — Encounter: Payer: Self-pay | Admitting: Osteopathic Medicine

## 2019-11-12 ENCOUNTER — Other Ambulatory Visit: Payer: Self-pay | Admitting: Osteopathic Medicine

## 2019-11-12 DIAGNOSIS — I10 Essential (primary) hypertension: Secondary | ICD-10-CM

## 2019-11-26 ENCOUNTER — Other Ambulatory Visit: Payer: Self-pay | Admitting: Osteopathic Medicine

## 2019-12-15 ENCOUNTER — Other Ambulatory Visit: Payer: Self-pay | Admitting: Osteopathic Medicine

## 2019-12-15 DIAGNOSIS — I10 Essential (primary) hypertension: Secondary | ICD-10-CM

## 2019-12-15 NOTE — Telephone Encounter (Signed)
Must schedule appointment

## 2019-12-24 ENCOUNTER — Encounter: Payer: Self-pay | Admitting: Osteopathic Medicine

## 2019-12-25 ENCOUNTER — Other Ambulatory Visit: Payer: Self-pay

## 2019-12-25 MED ORDER — CLONIDINE HCL 0.1 MG PO TABS: 0.1000 mg | ORAL_TABLET | Freq: Two times a day (BID) | ORAL | 0 refills | Status: DC

## 2020-01-22 ENCOUNTER — Other Ambulatory Visit: Payer: Self-pay | Admitting: Osteopathic Medicine

## 2020-01-22 DIAGNOSIS — I10 Essential (primary) hypertension: Secondary | ICD-10-CM

## 2020-03-23 ENCOUNTER — Other Ambulatory Visit: Payer: Self-pay | Admitting: Osteopathic Medicine

## 2020-04-28 ENCOUNTER — Other Ambulatory Visit: Payer: Self-pay | Admitting: Osteopathic Medicine

## 2020-05-06 ENCOUNTER — Other Ambulatory Visit: Payer: Self-pay | Admitting: Osteopathic Medicine

## 2020-05-17 ENCOUNTER — Other Ambulatory Visit: Payer: Self-pay | Admitting: Osteopathic Medicine

## 2020-05-17 NOTE — Telephone Encounter (Signed)
Refill request submitted via MyChart. Routing to assistant to address.  

## 2020-06-11 IMAGING — DX DG ANKLE COMPLETE 3+V*L*
3 series · 3 of 3 positions shown · non-contrast
Comparison: No recent prior.

CLINICAL DATA: Fall.  Achilles rupture.

EXAM:
LEFT ANKLE COMPLETE - 3+ VIEW

[ankle ap]
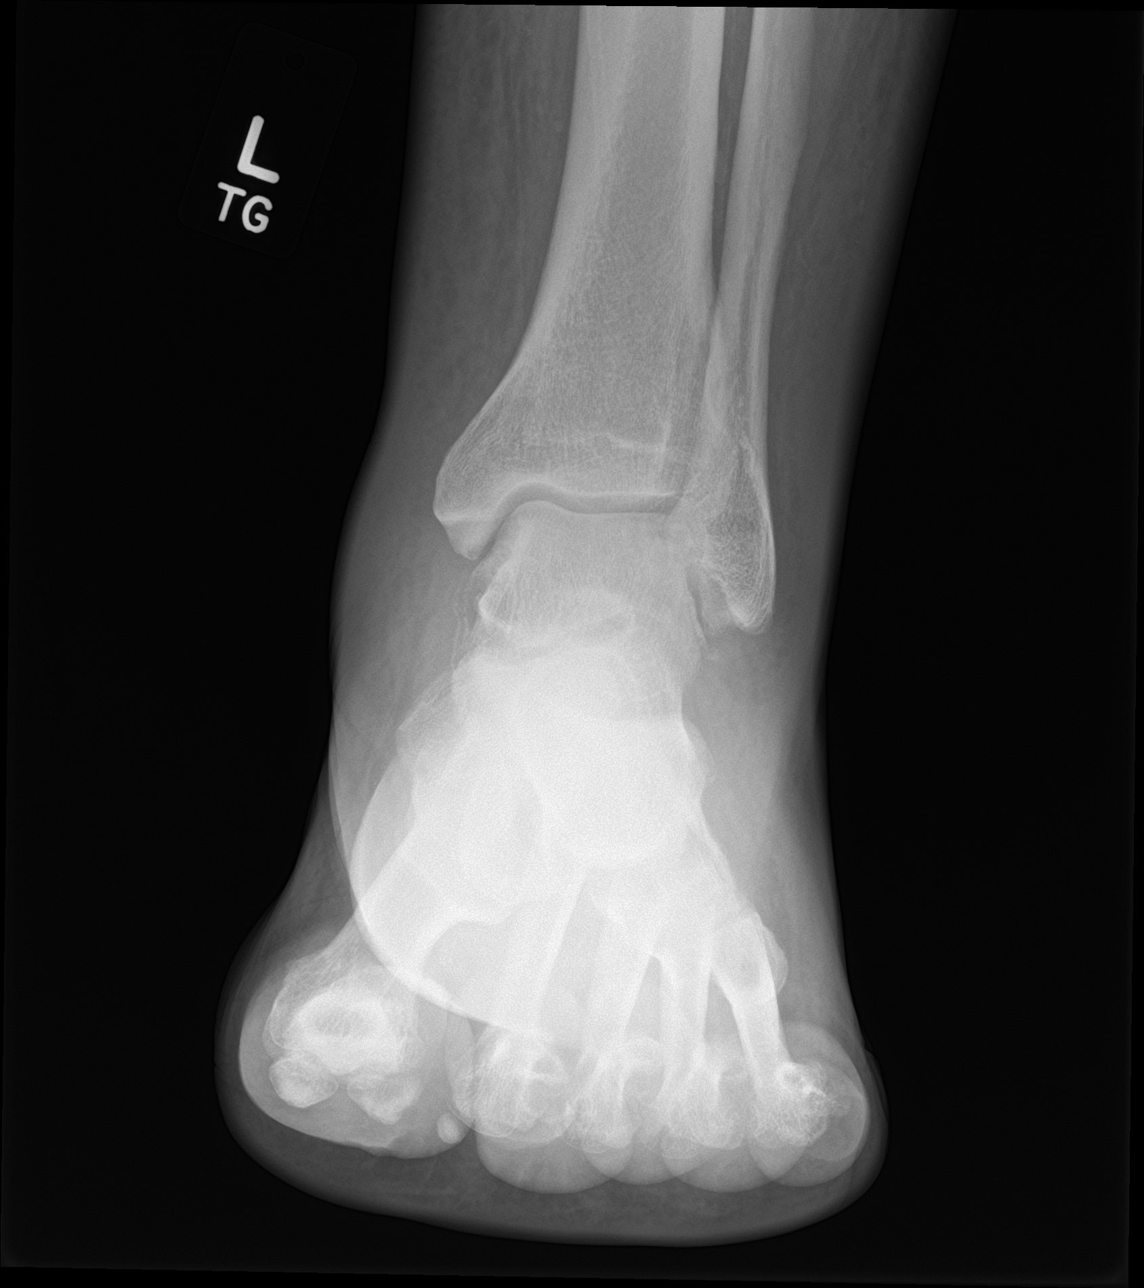

[ankle obl]
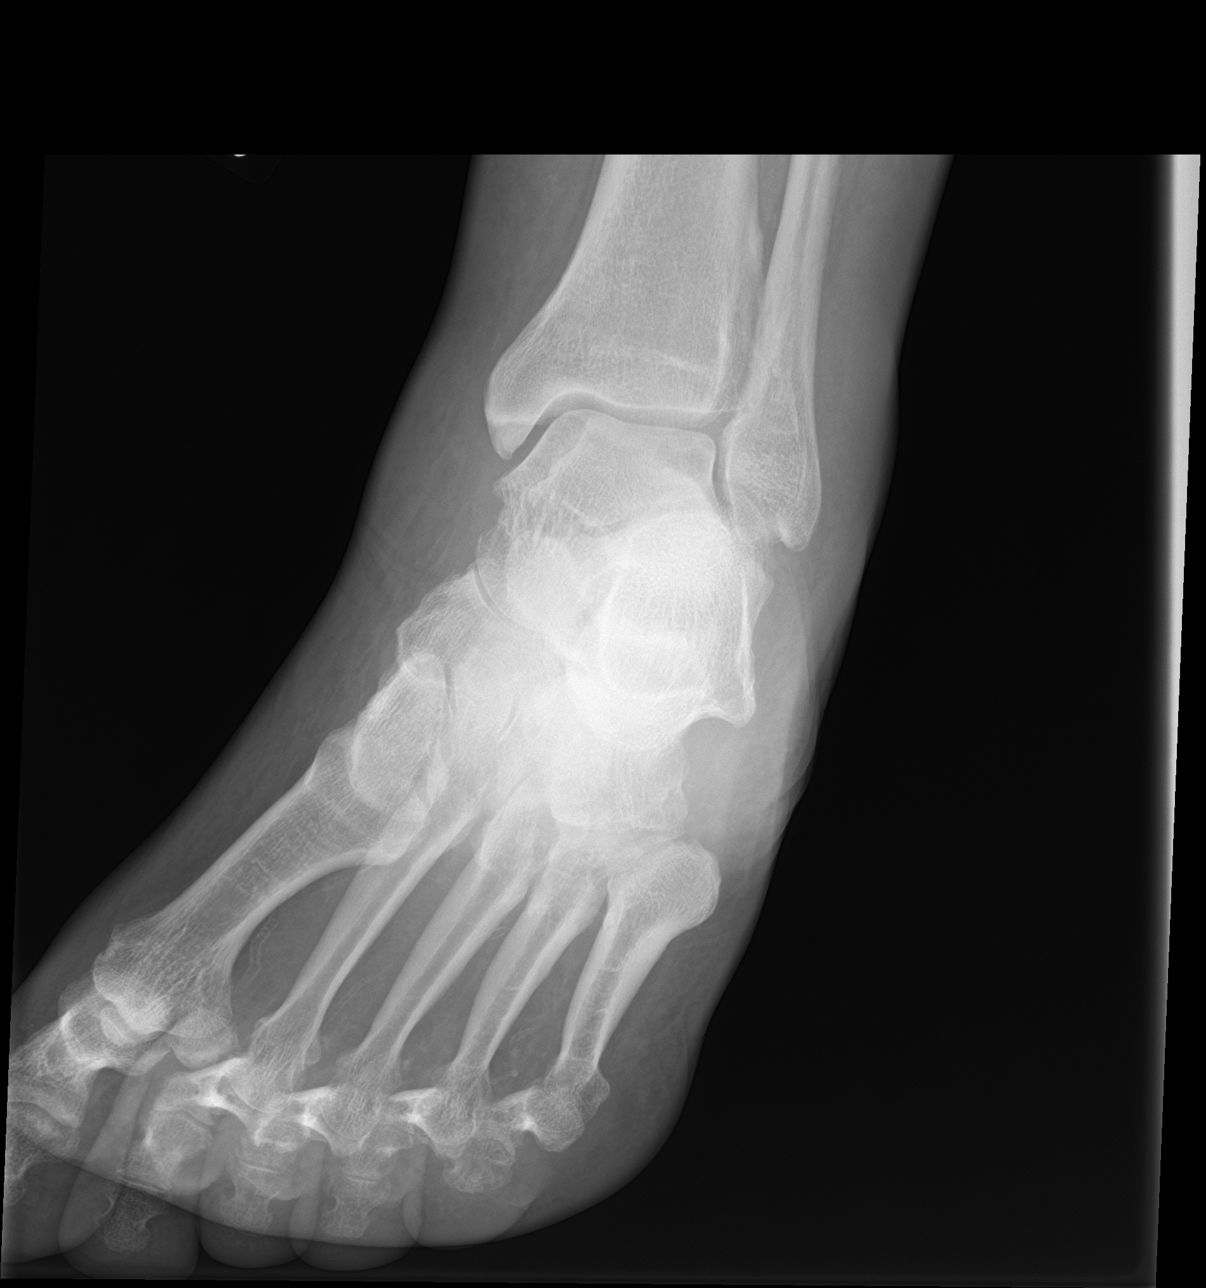

[ankle lat]
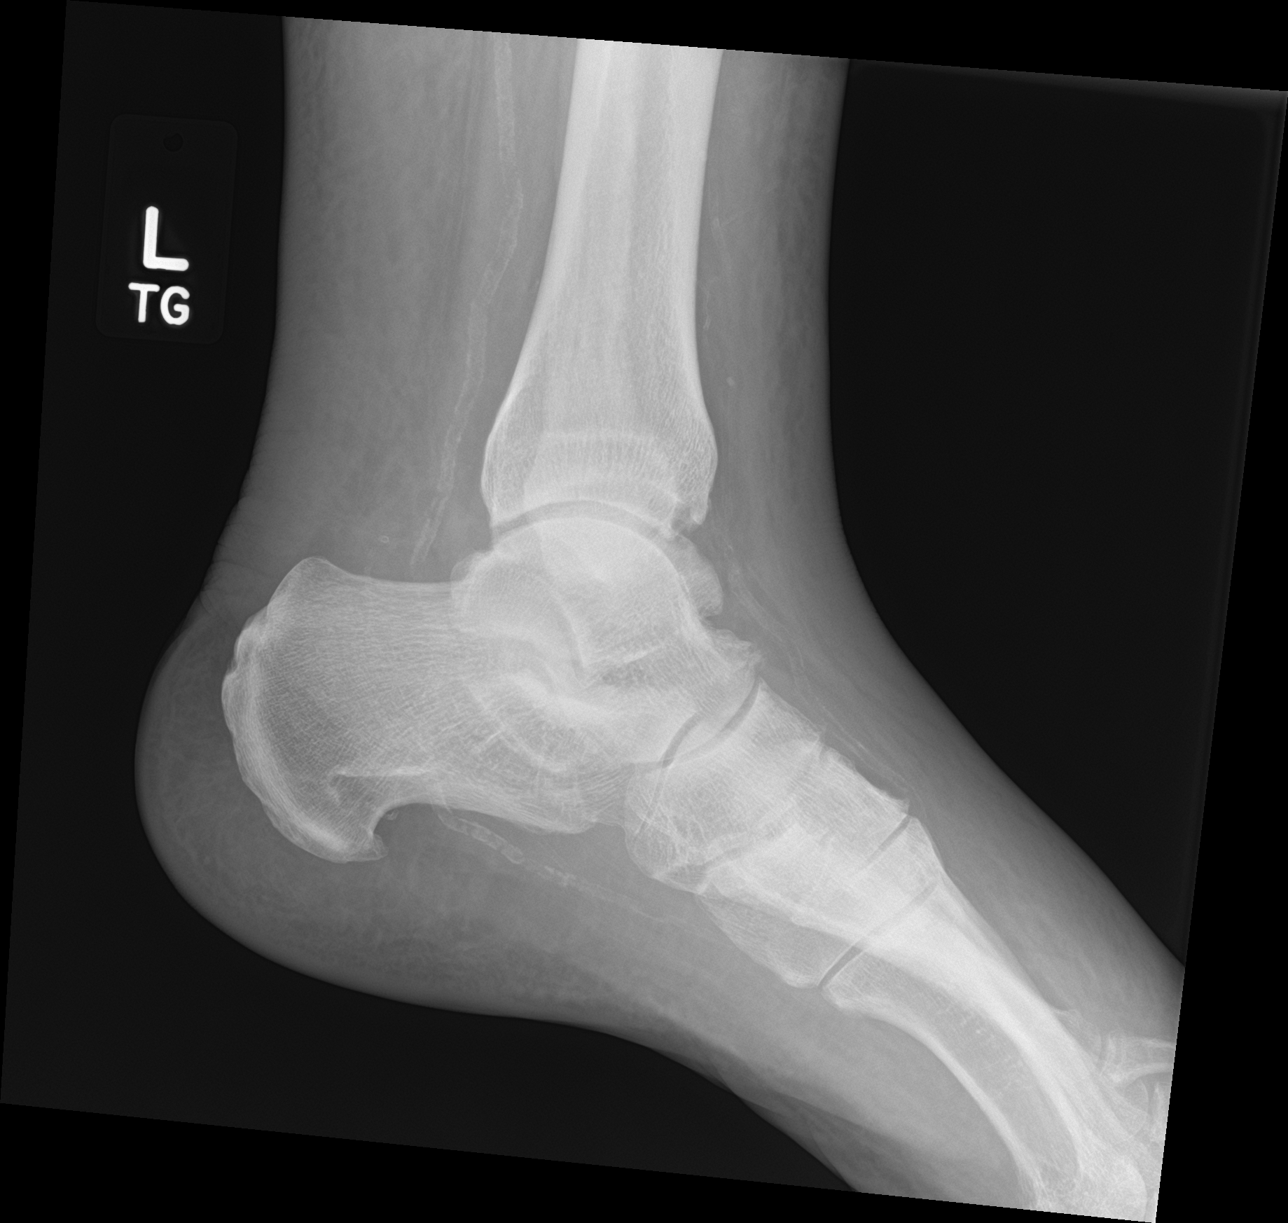

[3 of 3 positions shown; findings below may reference images not displayed]

FINDINGS: Diffuse soft tissue swelling. Small bony density noted adjacent to
the lateral malleolus. This could represent a small fracture
fragment arising from the lateral malleolus or the lateral aspect of
the talus. Age is undetermined. Linear lucency noted along the
lateral aspect of the talus. This could also represent a subtle
fracture. No other focal acute bony abnormality identified. Diffuse
degenerative change. Peripheral vascular calcification.
IMPRESSION: Small bony density noted adjacent to the lateral malleolus. This
could represent a small fracture fragment arising from the lateral
malleolus or talus. Age undetermined. Linear lucency noted along the
lateral aspect of the talus. This could also represent a subtle
fracture.

## 2020-07-26 ENCOUNTER — Other Ambulatory Visit: Payer: Self-pay | Admitting: Osteopathic Medicine

## 2020-07-26 DIAGNOSIS — E1165 Type 2 diabetes mellitus with hyperglycemia: Secondary | ICD-10-CM

## 2020-08-22 ENCOUNTER — Other Ambulatory Visit: Payer: Self-pay | Admitting: Osteopathic Medicine

## 2020-10-03 ENCOUNTER — Telehealth: Payer: Self-pay | Admitting: Neurology

## 2020-10-03 NOTE — Telephone Encounter (Signed)
Prior Authorization for [employer] - tried to submit via covermymeds. Unable to locate patient, will look into problem and try again.

## 2020-12-01 ENCOUNTER — Other Ambulatory Visit: Payer: Self-pay | Admitting: Osteopathic Medicine

## 2020-12-06 ENCOUNTER — Other Ambulatory Visit: Payer: Self-pay | Admitting: Osteopathic Medicine

## 2020-12-09 ENCOUNTER — Other Ambulatory Visit: Payer: Self-pay

## 2020-12-09 MED ORDER — LOVASTATIN 10 MG PO TABS: 10.0000 mg | ORAL_TABLET | Freq: Every day | ORAL | 0 refills | Status: AC

## 2021-08-25 ENCOUNTER — Other Ambulatory Visit: Payer: Self-pay | Admitting: Osteopathic Medicine

## 2021-08-25 DIAGNOSIS — E1165 Type 2 diabetes mellitus with hyperglycemia: Secondary | ICD-10-CM

## 2022-09-17 ENCOUNTER — Telehealth: Payer: Self-pay

## 2022-09-17 NOTE — Telephone Encounter (Signed)
Transition Care Management Unsuccessful Follow-up Telephone Call  Date of discharge and from where:  TCM DC [facility] 09-16-22 Dx: Acute systolic heart failure   Attempts:  1st Attempt  Reason for unsuccessful TCM follow-up call:  Left voice message   Woodfin Ganja LPN [facility] Nurse Health Advisor Direct Dial [phone]

## 2022-09-21 NOTE — Telephone Encounter (Signed)
Pt is following with Cardiologist on 09-23-22 - pt does not feel the need to fu with PCP - no TCM done.   Woodfin Ganja LPN [facility] Nurse Health Advisor Direct Dial [phone]
